# Patient Record
Sex: Female | Born: 1985 | Race: White | Hispanic: No | Marital: Married | Smoking: Current some day smoker
Health system: Southern US, Community
[De-identification: ages and names within clinical notes are randomized; demographics above are authoritative.]

## PROBLEM LIST (undated history)

## (undated) DIAGNOSIS — R112 Nausea with vomiting, unspecified: Secondary | ICD-10-CM

## (undated) DIAGNOSIS — R4589 Other symptoms and signs involving emotional state: Secondary | ICD-10-CM

## (undated) DIAGNOSIS — Z789 Other specified health status: Secondary | ICD-10-CM

## (undated) HISTORY — DX: Other specified health status: Z78.9

## (undated) HISTORY — PX: MOUTH SURGERY: SHX715

## (undated) HISTORY — DX: Nausea with vomiting, unspecified: R11.2

## (undated) HISTORY — DX: Other symptoms and signs involving emotional state: R45.89

---
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
## 2007-04-22 ENCOUNTER — Other Ambulatory Visit: Admission: RE | Admit: 2007-04-22 | Discharge: 2007-04-22 | Payer: Self-pay | Admitting: Gynecology

## 2009-05-01 ENCOUNTER — Ambulatory Visit: Payer: Self-pay | Admitting: Obstetrics and Gynecology

## 2009-05-01 ENCOUNTER — Inpatient Hospital Stay (HOSPITAL_COMMUNITY): Admission: AD | Admit: 2009-05-01 | Discharge: 2009-05-01 | Payer: Self-pay | Admitting: Obstetrics and Gynecology

## 2009-05-26 ENCOUNTER — Inpatient Hospital Stay (HOSPITAL_COMMUNITY): Admission: AD | Admit: 2009-05-26 | Discharge: 2009-05-26 | Payer: Self-pay | Admitting: Obstetrics & Gynecology

## 2009-05-28 ENCOUNTER — Ambulatory Visit: Payer: Self-pay | Admitting: Obstetrics and Gynecology

## 2009-05-28 ENCOUNTER — Inpatient Hospital Stay (HOSPITAL_COMMUNITY): Admission: AD | Admit: 2009-05-28 | Discharge: 2009-05-28 | Payer: Self-pay | Admitting: Obstetrics & Gynecology

## 2009-05-31 ENCOUNTER — Inpatient Hospital Stay (HOSPITAL_COMMUNITY): Admission: AD | Admit: 2009-05-31 | Discharge: 2009-05-31 | Payer: Self-pay | Admitting: Obstetrics & Gynecology

## 2009-06-01 ENCOUNTER — Ambulatory Visit: Payer: Self-pay | Admitting: Family

## 2009-06-01 ENCOUNTER — Inpatient Hospital Stay (HOSPITAL_COMMUNITY): Admission: AD | Admit: 2009-06-01 | Discharge: 2009-06-03 | Payer: Self-pay | Admitting: Obstetrics & Gynecology

## 2010-02-20 ENCOUNTER — Ambulatory Visit (HOSPITAL_COMMUNITY)
Admission: RE | Admit: 2010-02-20 | Discharge: 2010-02-20 | Payer: Self-pay | Source: Home / Self Care | Admitting: Internal Medicine

## 2010-04-24 IMAGING — US US ABDOMEN COMPLETE
1 series · 14 of 25 positions shown · non-contrast
Comparison: None

CLINICAL DATA: Nausea, abdominal pain

ULTRASOUND ABDOMEN:
TECHNIQUE: Sonography of upper abdominal structures was performed.

[Series 1: us abdomen complete · 0.28mm/px · 14 of 95 slices shown]
[im 1/95]
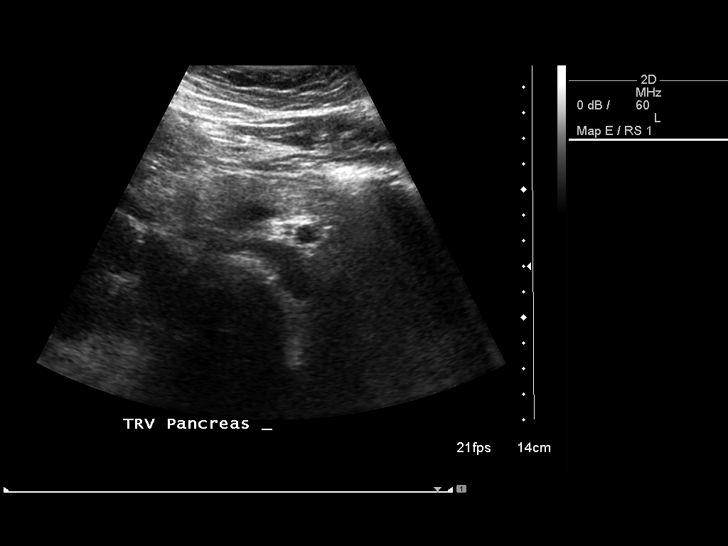
[im 8/95]
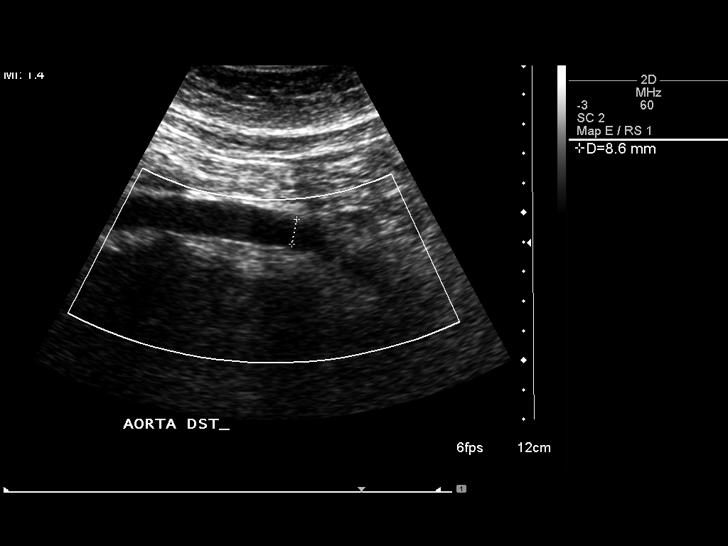
[im 16/95]
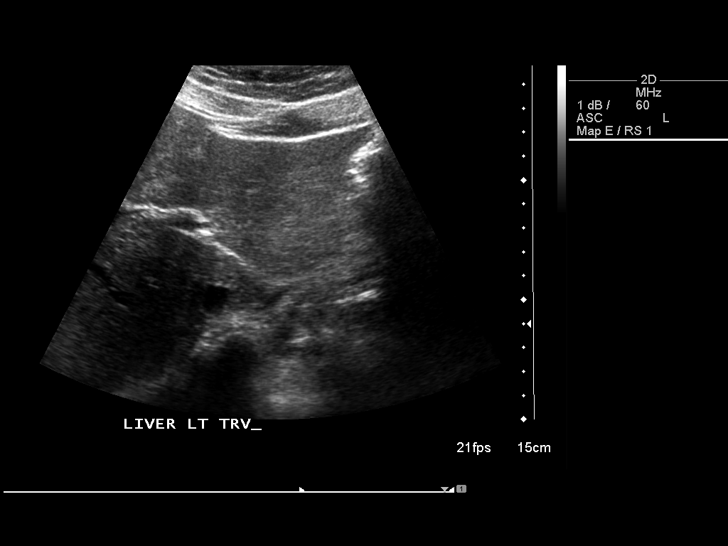
[im 24/95]
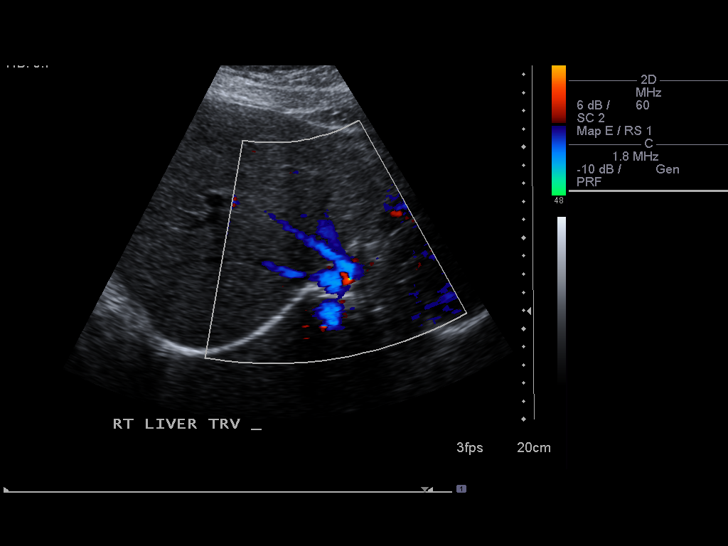
[im 32/95]
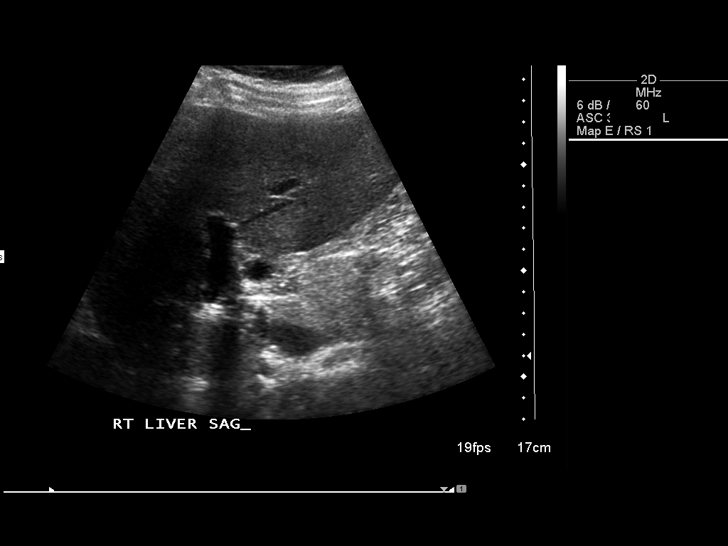
[im 36/95]
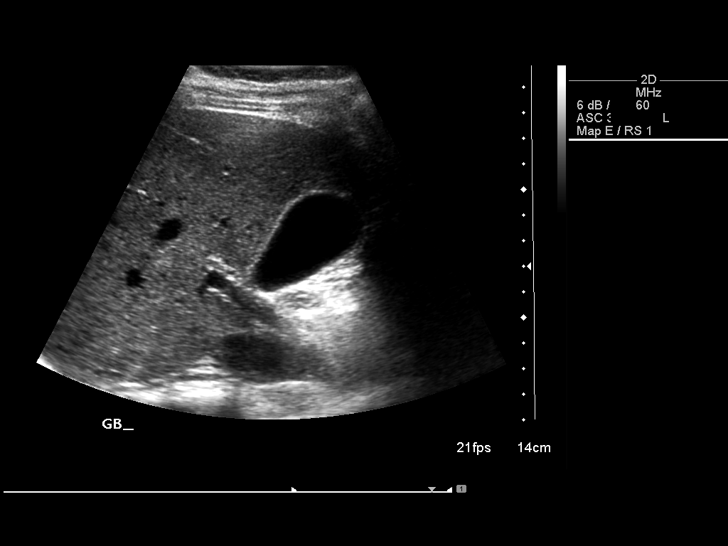
[im 44/95]
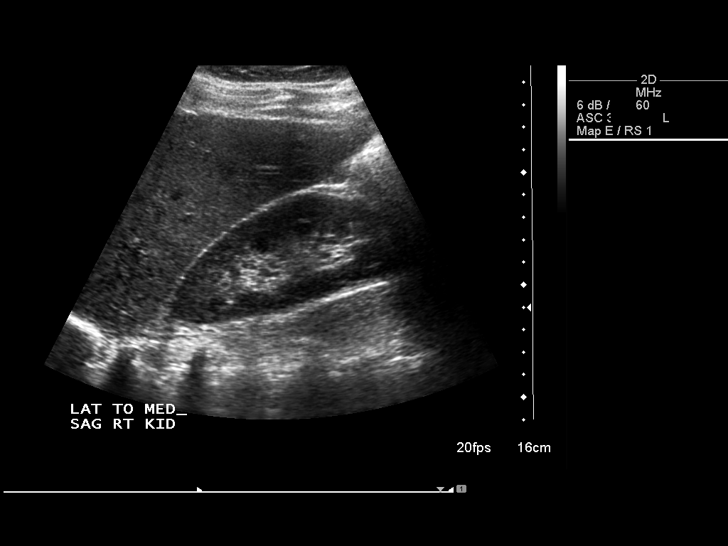
[im 51/95]
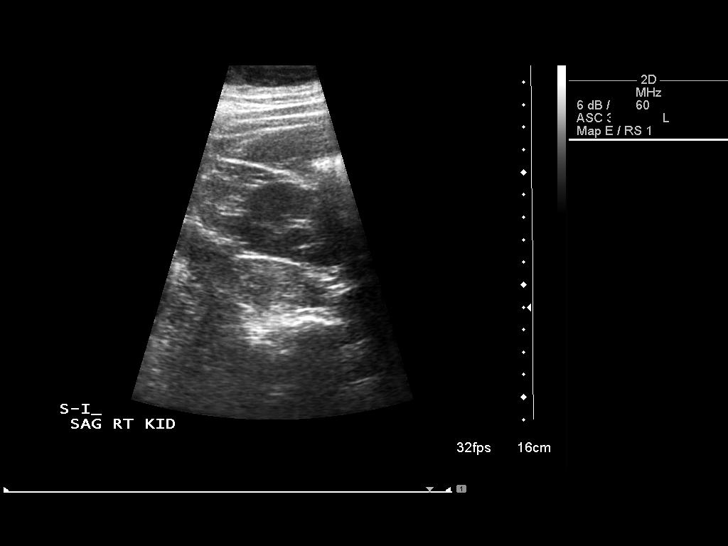
[im 59/95]
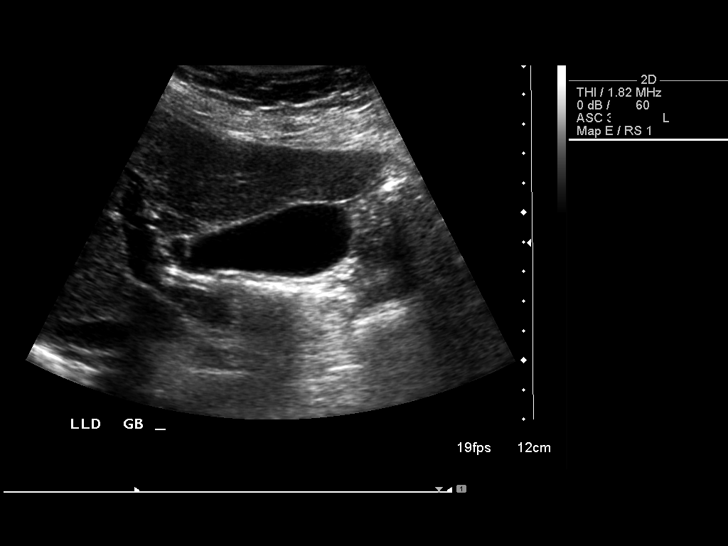
[im 63/95]
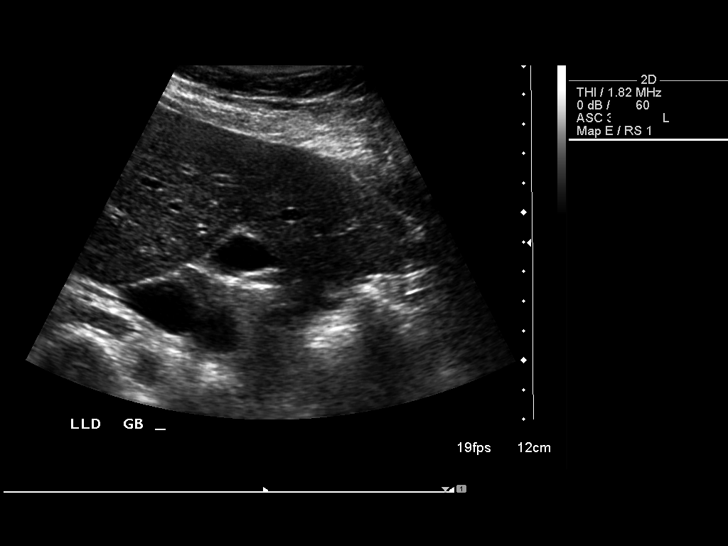
[im 71/95]
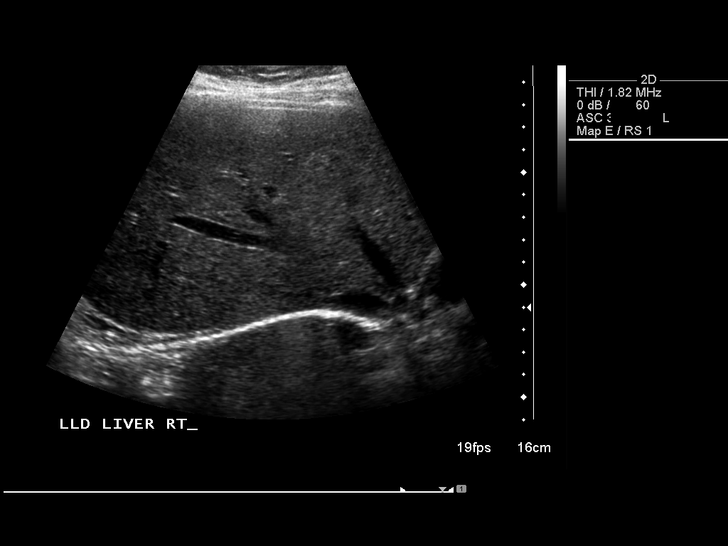
[im 79/95]
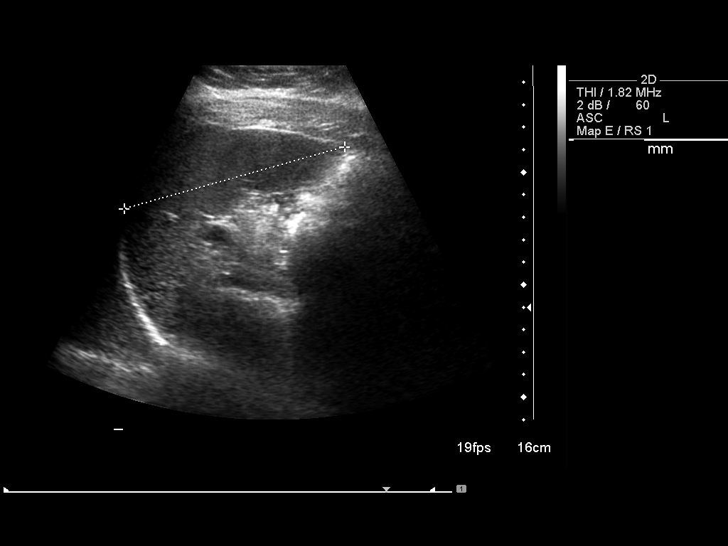
[im 87/95]
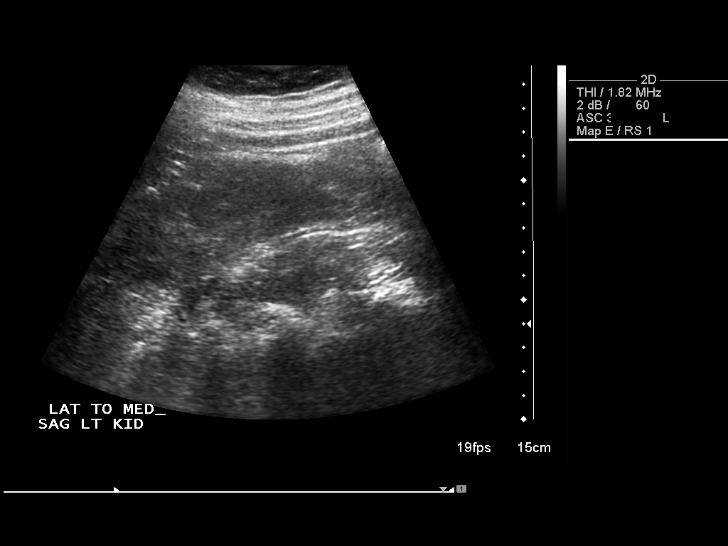
[im 95/95]
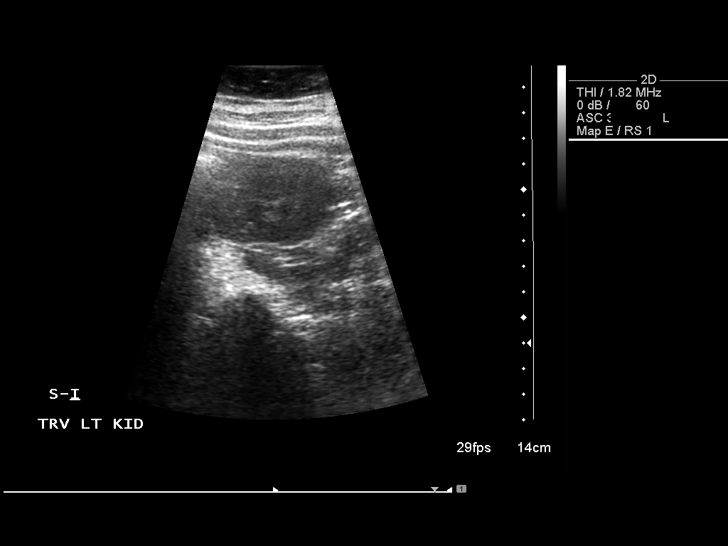

[14 of 25 positions shown; findings below may reference images not displayed]

Gallbladder:  Normally distended without stones or wall thickening.
No pericholecystic fluid or sonographic Murphy's sign.

Common bile duct:  Normal caliber 2 mm diameter.

Liver:  Normal appearance

IVC:  Unremarkable

Pancreas:  Small portion of head and uncinate process obscured by
bowel gas, remainder normal in appearance.

Spleen:  Normal appearance, 10.2 cm length.

Right kidney:  11.9 cm length.  Normal morphology without mass or
hydronephrosis.

Left kidney:  11.8 cm length.  Normal morphology without mass or
hydronephrosis.

Aorta:  Unremarkable

Other:  No free fluid
IMPRESSION: Incomplete pancreatic visualization due to bowel gas.
Remainder of exam normal appearance.

## 2010-05-10 ENCOUNTER — Emergency Department (HOSPITAL_COMMUNITY)
Admission: EM | Admit: 2010-05-10 | Discharge: 2010-05-10 | Payer: Self-pay | Source: Home / Self Care | Admitting: Emergency Medicine

## 2011-01-20 LAB — URINALYSIS, ROUTINE W REFLEX MICROSCOPIC
Glucose, UA: NEGATIVE mg/dL
Nitrite: NEGATIVE
Protein, ur: NEGATIVE mg/dL
Specific Gravity, Urine: >1.03 — ABNORMAL HIGH (ref 1.005–1.030)

## 2011-01-20 LAB — BASIC METABOLIC PANEL
CO2: 29 mEq/L (ref 19–32)
Calcium: 9.3 mg/dL (ref 8.4–10.5)
Chloride: 102 mEq/L (ref 96–112)
GFR calc Af Amer: >60 mL/min (ref >60)
Glucose, Bld: 91 mg/dL (ref 70–99)

## 2011-01-20 LAB — DIFFERENTIAL
Eosinophils Relative: 1 % (ref 0–5)
Lymphs Abs: 2 10*3/uL (ref 0.7–4.0)
Monocytes Absolute: 0.7 10*3/uL (ref 0.1–1.0)
Monocytes Relative: 9 % (ref 3–12)
Neutro Abs: 5.4 10*3/uL (ref 1.7–7.7)

## 2011-01-20 LAB — CBC
Hemoglobin: 12.2 g/dL (ref 12.0–15.0)
MCH: 25.5 pg — ABNORMAL LOW (ref 26.0–34.0)

## 2011-01-20 LAB — URINE CULTURE

## 2011-01-20 LAB — URINE MICROSCOPIC-ADD ON

## 2011-02-10 LAB — CBC
MCHC: 33.8 g/dL (ref 30.0–36.0)
Platelets: 198 10*3/uL (ref 150–400)
RBC: 4.08 MIL/uL (ref 3.87–5.11)
RDW: 14.4 % (ref 11.5–15.5)

## 2011-02-10 LAB — RPR: RPR Ser Ql: NONREACTIVE

## 2013-04-26 ENCOUNTER — Encounter: Payer: Self-pay | Admitting: *Deleted

## 2013-04-27 ENCOUNTER — Ambulatory Visit (INDEPENDENT_AMBULATORY_CARE_PROVIDER_SITE_OTHER): Payer: BC Managed Care – PPO | Admitting: Adult Health

## 2013-04-27 ENCOUNTER — Encounter: Payer: Self-pay | Admitting: Adult Health

## 2013-04-27 VITALS — BP 122/80 | Ht 63.0 in | Wt 185.0 lb

## 2013-04-27 DIAGNOSIS — Z3202 Encounter for pregnancy test, result negative: Secondary | ICD-10-CM

## 2013-04-27 DIAGNOSIS — R4589 Other symptoms and signs involving emotional state: Secondary | ICD-10-CM

## 2013-04-27 DIAGNOSIS — R112 Nausea with vomiting, unspecified: Secondary | ICD-10-CM | POA: Insufficient documentation

## 2013-04-27 DIAGNOSIS — Z3200 Encounter for pregnancy test, result unknown: Secondary | ICD-10-CM

## 2013-04-27 HISTORY — DX: Other symptoms and signs involving emotional state: R45.89

## 2013-04-27 HISTORY — DX: Nausea with vomiting, unspecified: R11.2

## 2013-04-27 LAB — COMPREHENSIVE METABOLIC PANEL
ALT: 15 U/L (ref 0–35)
AST: 18 U/L (ref 0–37)
Potassium: 4.5 mEq/L (ref 3.5–5.3)
Total Bilirubin: 0.5 mg/dL (ref 0.3–1.2)
Total Protein: 7.5 g/dL (ref 6.0–8.3)

## 2013-04-27 LAB — POCT URINE PREGNANCY: Preg Test, Ur: NEGATIVE

## 2013-04-27 LAB — CBC
MCHC: 33.9 g/dL (ref 30.0–36.0)
MCV: 77.4 fL — ABNORMAL LOW (ref 78.0–100.0)
WBC: 8 10*3/uL (ref 4.0–10.5)

## 2013-04-27 NOTE — Patient Instructions (Addendum)
Call in am for results Eat often

## 2013-04-27 NOTE — Progress Notes (Signed)
Subjective:     Patient ID: Rhonda Ali, female   DOB: Apr 03, 1986, 27 y.o.   MRN: [id number]  HPI Rhonda Ali is a 27 year old white female in complaining of nausea and vomiting, and moodiness like when she was pregnant. She has had negative pregnancy test at home.She has the mirena IUD and has had spotting on and off since last period which around 6/214.And she has a decrease in appetite.She does say that she feels better after she vomits and is ok after that.  Review of SystemsPositives in HPI Patient denies any headaches, blurred vision, shortness of breath, chest pain, abdominal pain, problems with bowel movements, urination, or intercourse. No joint pain.  Reviewed past medical,surgical, social and family history. Reviewed medications and allergies.     Objective:   Physical Exam BP 122/80  Ht 5\' 3"  (1.6 m)  Wt 185 lb (83.915 kg)  BMI 32.78 kg/m2  LMP 06/02/2014Urine pregnancy test negative. Skin warm and dry, abdomen soft with no HSM and no tenderness noted. Pelvic: external genitalia is normal in appearance, vagina: is normal in appearance,, cervix:smooth and bulbous,IUD strings are visible, no bleeding noted, and no CMT, uterus: normal size, shape and contour, non tender, no masses felt, adnexa: no masses or tenderness noted.     Assessment:      Nausea and vomiting Moodiness    Plan:      Call in am for lab results Check CBC,CMP,TSH and [facility] Eat often and eat bland, avoid spicy and fried foods today

## 2013-04-27 NOTE — Progress Notes (Signed)
Patient ID: Rhonda Ali, female   DOB: [birth year], 27 y.o.   MRN: [id number] Pt states she is having terrible nausea in the mornings, some foods and smells make her sick. She stated she has been very emotional and moody lately. Some dizziness noted as well. Pt states she just feels funny. Took home preg test and it was negative. Pt has mirena IUD.

## 2013-04-28 ENCOUNTER — Telehealth: Payer: Self-pay | Admitting: Adult Health

## 2013-04-28 LAB — TSH: TSH: 0.779 u[IU]/mL (ref 0.350–4.500)

## 2013-04-28 NOTE — Telephone Encounter (Signed)
Jennifer, please review labs and let me know if you want me to call her. Thanks! 

## 2013-04-28 NOTE — Telephone Encounter (Signed)
JAG, she left a different number. [phone]. Thanks!!!

## 2013-04-28 NOTE — Telephone Encounter (Signed)
Pt aware of labs and she feels better today, call prn

## 2014-07-19 ENCOUNTER — Ambulatory Visit (INDEPENDENT_AMBULATORY_CARE_PROVIDER_SITE_OTHER): Payer: BC Managed Care – PPO | Admitting: Advanced Practice Midwife

## 2014-07-19 ENCOUNTER — Encounter: Payer: Self-pay | Admitting: Advanced Practice Midwife

## 2014-07-19 VITALS — BP 140/80 | Ht 63.0 in | Wt 154.0 lb

## 2014-07-19 DIAGNOSIS — Z3202 Encounter for pregnancy test, result negative: Secondary | ICD-10-CM

## 2014-07-19 DIAGNOSIS — Z30433 Encounter for removal and reinsertion of intrauterine contraceptive device: Secondary | ICD-10-CM

## 2014-07-19 LAB — POCT URINE PREGNANCY: PREG TEST UR: NEGATIVE

## 2014-07-19 NOTE — Progress Notes (Signed)
Rhonda Ali is a 28 y.o. year old Caucasian female who presents for removal and replacement of a  IUD.  It has been 5 years since her previous Mirena IUD placement, and will be getting a Lyletta IUD today.  The risks and benefits of the method and placement have been thouroughly reviewed with the patient and all questions were answered.  Specifically the patient is aware of failure rate of 11/998, expulsion of the IUD and of possible perforation.  The patient is aware of irregular bleeding due to the method and understands the incidence of irregular bleeding diminishes with time.  Time out was performed.  A Graves speculum was placed.  The cervix was prepped using Betadine. The strings were found to be  visible.   They were grasped and the Mirena was easily removed. The cervix was then grasped with a tenaculum and the uterus was sounded to 7 cm. The IUD was inserted to 7 cm.  It was pulled back 1 cm and the IUD was disengaged. After 10 seconds, the IUD was inserted back to the fundus.  The strings were trimmed to 3 cm.  Sonogram was performed and the proper placement of the IUD was verified.  The patient was instructed on signs and symptoms of infection and to check for the strings after each menses or each month.  The patient is to refrain from intercourse for 3 days.

## 2014-08-03 ENCOUNTER — Ambulatory Visit: Payer: BC Managed Care – PPO | Admitting: Advanced Practice Midwife

## 2014-08-17 ENCOUNTER — Ambulatory Visit: Payer: BC Managed Care – PPO | Admitting: Advanced Practice Midwife

## 2014-09-05 ENCOUNTER — Encounter: Payer: Self-pay | Admitting: Advanced Practice Midwife

## 2017-05-27 ENCOUNTER — Encounter: Payer: Self-pay | Admitting: Women's Health

## 2017-05-27 ENCOUNTER — Ambulatory Visit (INDEPENDENT_AMBULATORY_CARE_PROVIDER_SITE_OTHER): Payer: Managed Care, Other (non HMO) | Admitting: Women's Health

## 2017-05-27 VITALS — BP 110/70 | HR 76 | Wt 142.2 lb

## 2017-05-27 DIAGNOSIS — Z3046 Encounter for surveillance of implantable subdermal contraceptive: Secondary | ICD-10-CM

## 2017-05-27 DIAGNOSIS — Z30017 Encounter for initial prescription of implantable subdermal contraceptive: Secondary | ICD-10-CM | POA: Insufficient documentation

## 2017-05-27 DIAGNOSIS — Z3202 Encounter for pregnancy test, result negative: Secondary | ICD-10-CM

## 2017-05-27 DIAGNOSIS — Z30432 Encounter for removal of intrauterine contraceptive device: Secondary | ICD-10-CM

## 2017-05-27 LAB — POCT URINE PREGNANCY: Preg Test, Ur: NEGATIVE

## 2017-05-27 MED ORDER — ETONOGESTREL 68 MG ~~LOC~~ IMPL
68.0000 mg | DRUG_IMPLANT | Freq: Once | SUBCUTANEOUS | Status: AC
  Administered 2017-05-27: 68 mg via SUBCUTANEOUS

## 2017-05-27 NOTE — Progress Notes (Signed)
Rhonda Ali is a 31 y.o. year old 651P1000 Caucasian female who presents for removal of a Liletta IUD and insertion of Nexplanon. Her Liletta IUD was placed 07/19/14. She wants it removed b/c she can feel it and it bothers her.  Her pregnancy test today was negative.  Risks/benefits/side effects of Nexplanon have been discussed and her questions have been answered.  Specifically, a failure rate of 11/998 has been reported, with an increased failure rate if pt takes St. John's Wort and/or antiseizure medicaitons.  Rhonda Ali is aware of the common side effect of irregular bleeding, which the incidence of decreases over time.  BP 110/70   Pulse 76   Wt 142 lb 3.2 oz (64.5 kg)   BMI 25.19 kg/m   Results for orders placed or performed in visit on 05/27/17 (from the past 24 hour(s))  POCT urine pregnancy   Collection Time: 05/27/17 11:14 AM  Result Value Ref Range   Preg Test, Ur Negative Negative    Time out was performed.  A graves speculum was placed in the vagina.  The cervix was visualized, and the strings were visible. They were grasped and the Liletta was easily removed intact without complications.    She is right-handed, so her left arm, approximately 4 inches proximal from the elbow, was cleansed with alcohol and anesthetized with 2cc of 2% Lidocaine.  The area was cleansed again with betadine and the Nexplanon was inserted per manufacturer's recommendations without difficulty.  3 steri-strips and pressure bandage were applied.  Pt was instructed to keep the area clean and dry, remove pressure bandage in 24 hours, and keep insertion site covered with the steri-strip for 3-5 days.  Back up contraception was recommended for 2 weeks.  She was given a card indicating date Nexplanon was inserted and date it needs to be removed. Follow-up PRN problems, then 1mth for pap & physical.   Marge DuncansBooker, Rhonda Ali CNM, [facility]WHNP-BC 05/27/2017 11:34 AM

## 2017-05-27 NOTE — Patient Instructions (Signed)

## 2017-06-27 ENCOUNTER — Other Ambulatory Visit (HOSPITAL_COMMUNITY)
Admission: RE | Admit: 2017-06-27 | Discharge: 2017-06-27 | Disposition: A | Discharge status: Home / Self Care | Payer: Managed Care, Other (non HMO) | Source: Ambulatory Visit | Attending: Obstetrics & Gynecology | Admitting: Obstetrics & Gynecology

## 2017-06-27 ENCOUNTER — Encounter: Payer: Self-pay | Admitting: Women's Health

## 2017-06-27 ENCOUNTER — Ambulatory Visit (INDEPENDENT_AMBULATORY_CARE_PROVIDER_SITE_OTHER): Payer: Managed Care, Other (non HMO) | Admitting: Women's Health

## 2017-06-27 VITALS — BP 90/60 | HR 76 | Ht 62.0 in | Wt 152.0 lb

## 2017-06-27 DIAGNOSIS — Z01411 Encounter for gynecological examination (general) (routine) with abnormal findings: Secondary | ICD-10-CM

## 2017-06-27 DIAGNOSIS — N760 Acute vaginitis: Secondary | ICD-10-CM | POA: Diagnosis not present

## 2017-06-27 DIAGNOSIS — B9689 Other specified bacterial agents as the cause of diseases classified elsewhere: Secondary | ICD-10-CM

## 2017-06-27 DIAGNOSIS — N898 Other specified noninflammatory disorders of vagina: Secondary | ICD-10-CM | POA: Diagnosis not present

## 2017-06-27 DIAGNOSIS — F17200 Nicotine dependence, unspecified, uncomplicated: Secondary | ICD-10-CM | POA: Diagnosis not present

## 2017-06-27 DIAGNOSIS — Z01419 Encounter for gynecological examination (general) (routine) without abnormal findings: Secondary | ICD-10-CM | POA: Diagnosis not present

## 2017-06-27 LAB — POCT WET PREP (WET MOUNT)
CLUE CELLS WET PREP WHIFF POC: POSITIVE
TRICHOMONAS WET PREP HPF POC: ABSENT

## 2017-06-27 MED ORDER — METRONIDAZOLE 500 MG PO TABS: 500.0000 mg | ORAL_TABLET | Freq: Two times a day (BID) | ORAL | 0 refills | Status: DC

## 2017-06-27 NOTE — Addendum Note (Signed)
Addended by: Federico Flake A on: 06/27/2017 09:37 AM   Modules accepted: Orders

## 2017-06-27 NOTE — Progress Notes (Signed)
Subjective:   Rhonda Ali is a 31 y.o. G62P1001 Caucasian female here for a routine well-woman exam.  Patient's last menstrual period was 06/15/2017.    Current complaints: none PCP: [facility]       Does not desire labs, does want gc/ct on pap  Social History: Sexual: heterosexual Marital Status: dating Living situation: w/ parents & her son Occupation: [employer] Tobacco/alcohol: 3 cigarettes/day and working on quitting, occ etoh Illicit drugs: no history of illicit drug use  The following portions of the patient's history were reviewed and updated as appropriate: allergies, current medications, past family history, past medical history, past social history, past surgical history and problem list.  Past Medical History Past Medical History:  Diagnosis Date  . Medical history non-contributory   . Moodiness 04/27/2013  . Nausea & vomiting 04/27/2013    Past Surgical History Past Surgical History:  Procedure Laterality Date  . MOUTH SURGERY      Gynecologic History G1P1000  Patient's last menstrual period was 06/15/2017. Contraception: nexplanon Last Pap: >7yrs ago. Results were: normal Last mammogram: never. Results were: n/a Last TCS: never  Obstetric History OB History  Gravida Para Term Preterm AB Living  1 1 1     1   SAB TAB Ectopic Multiple Live Births          1    # Outcome Date GA Lbr Len/2nd Weight Sex Delivery Anes PTL Lv  1 Term 06/01/09 [redacted]w[redacted]d  7 lb 13 oz (3.544 kg) M Vag-Spont EPI  LIV      Current Medications Current Outpatient Prescriptions on File Prior to Visit  Medication Sig Dispense Refill  . levonorgestrel (MIRENA) 20 MCG/24HR IUD 1 each by Intrauterine route once.     No current facility-administered medications on file prior to visit.     Review of Systems Patient denies any headaches, blurred vision, shortness of breath, chest pain, abdominal pain, problems with bowel movements, urination, or intercourse.  Objective:  BP  90/60   Pulse 76   Ht 5\' 2"  (1.575 m)   Wt 152 lb (68.9 kg)   LMP 06/15/2017   BMI 27.80 kg/m  Physical Exam  General:  Well developed, well nourished, no acute distress. She is alert and oriented x3. Skin:  Warm and dry Neck:  Midline trachea, no thyromegaly or nodules Cardiovascular: Regular rate and rhythm, no murmur heard Lungs:  Effort normal, all lung fields clear to auscultation bilaterally Breasts:  No dominant palpable mass, retraction, or nipple discharge Abdomen:  Soft, non tender, no hepatosplenomegaly or masses Pelvic:  External genitalia is normal in appearance.  The vagina is normal in appearance. The cervix is bulbous, no CMT. +bleeding w/ pap, mod amt thin yellow malodorous d/c.  Thin prep pap is done w/ reflex HR HPV cotesting. Uterus is felt to be normal size, shape, and contour.  No adnexal masses or tenderness noted. Extremities:  No swelling or varicosities noted Psych:  She has a normal mood and affect  Results for orders placed or performed in visit on 06/27/17 (from the past 24 hour(s))  POCT Wet Prep [facility])     Status: Abnormal   Collection Time: 06/27/17  9:30 AM  Result Value Ref Range   Source Wet Prep POC vaginal    WBC, Wet Prep HPF POC mod    Bacteria Wet Prep HPF POC None (A) Few   BACTERIA WET PREP MORPHOLOGY POC     Clue Cells Wet Prep HPF POC  Many (A) None   Clue Cells Wet Prep Whiff POC Positive Whiff    Yeast Wet Prep HPF POC None    KOH Wet Prep POC     Trichomonas Wet Prep HPF POC Absent Absent     Assessment:   Healthy well-woman exam BV Smoker  Plan:  Rx metronidazole 500mg  BID x 7d for BV, no sex or etoh while taking  Advised complete smoking cessation F/U 48yr for physical, or sooner if needed Mammogram @29yo  or sooner if problems Colonoscopy @29yo  or sooner if problems  Marge Duncans CNM, WHNP-BC 06/27/2017 9:30 AM

## 2017-06-27 NOTE — Patient Instructions (Signed)
Bacterial Vaginosis Bacterial vaginosis is a vaginal infection that occurs when the normal balance of bacteria in the vagina is disrupted. It results from an overgrowth of certain bacteria. This is the most common vaginal infection among women ages 15-44. Because bacterial vaginosis increases your risk for STIs (sexually transmitted infections), getting treated can help reduce your risk for chlamydia, gonorrhea, herpes, and HIV (human immunodeficiency virus). Treatment is also important for preventing complications in pregnant women, because this condition can cause an early (premature) delivery. What are the causes? This condition is caused by an increase in harmful bacteria that are normally present in small amounts in the vagina. However, the reason that the condition develops is not fully understood. What increases the risk? The following factors may make you more likely to develop this condition:  Having a new sexual partner or multiple sexual partners.  Having unprotected sex.  Douching.  Having an intrauterine device (IUD).  Smoking.  Drug and alcohol abuse.  Taking certain antibiotic medicines.  Being pregnant.  You cannot get bacterial vaginosis from toilet seats, bedding, swimming pools, or contact with objects around you. What are the signs or symptoms? Symptoms of this condition include:  Grey or white vaginal discharge. The discharge can also be watery or foamy.  A fish-like odor with discharge, especially after sexual intercourse or during menstruation.  Itching in and around the vagina.  Burning or pain with urination.  Some women with bacterial vaginosis have no signs or symptoms. How is this diagnosed? This condition is diagnosed based on:  Your medical history.  A physical exam of the vagina.  Testing a sample of vaginal fluid under a microscope to look for a large amount of bad bacteria or abnormal cells. Your health care provider may use a cotton swab  or a small wooden spatula to collect the sample.  How is this treated? This condition is treated with antibiotics. These may be given as a pill, a vaginal cream, or a medicine that is put into the vagina (suppository). If the condition comes back after treatment, a second round of antibiotics may be needed. Follow these instructions at home: Medicines  Take over-the-counter and prescription medicines only as told by your health care provider.  Take or use your antibiotic as told by your health care provider. Do not stop taking or using the antibiotic even if you start to feel better. General instructions  If you have a female sexual partner, tell her that you have a vaginal infection. She should see her health care provider and be treated if she has symptoms. If you have a female sexual partner, he does not need treatment.  During treatment: ? Avoid sexual activity until you finish treatment. ? Do not douche. ? Avoid alcohol as directed by your health care provider. ? Avoid breastfeeding as directed by your health care provider.  Drink enough water and fluids to keep your urine clear or pale yellow.  Keep the area around your vagina and rectum clean. ? Wash the area daily with warm water. ? Wipe yourself from front to back after using the toilet.  Keep all follow-up visits as told by your health care provider. This is important. How is this prevented?  Do not douche.  Wash the outside of your vagina with warm water only.  Use protection when having sex. This includes latex condoms and dental dams.  Limit how many sexual partners you have. To help prevent bacterial vaginosis, it is best to have sex with just   one partner (monogamous).  Make sure you and your sexual partner are tested for STIs.  Wear cotton or cotton-lined underwear.  Avoid wearing tight pants and pantyhose, especially during summer.  Limit the amount of alcohol that you drink.  Do not use any products that  contain nicotine or tobacco, such as cigarettes and e-cigarettes. If you need help quitting, ask your health care provider.  Do not use illegal drugs. Where to find more information:  Centers for Disease Control and Prevention: www.cdc.gov/std  American Sexual Health Association (ASHA): www.ashastd.org  U.S. Department of Health and Human Services, Office on Women's Health: www.womenshealth.gov/ or https://www.womenshealth.gov/a-z-topics/bacterial-vaginosis Contact a health care provider if:  Your symptoms do not improve, even after treatment.  You have more discharge or pain when urinating.  You have a fever.  You have pain in your abdomen.  You have pain during sex.  You have vaginal bleeding between periods. Summary  Bacterial vaginosis is a vaginal infection that occurs when the normal balance of bacteria in the vagina is disrupted.  Because bacterial vaginosis increases your risk for STIs (sexually transmitted infections), getting treated can help reduce your risk for chlamydia, gonorrhea, herpes, and HIV (human immunodeficiency virus). Treatment is also important for preventing complications in pregnant women, because the condition can cause an early (premature) delivery.  This condition is treated with antibiotic medicines. These may be given as a pill, a vaginal cream, or a medicine that is put into the vagina (suppository). This information is not intended to replace advice given to you by your health care provider. Make sure you discuss any questions you have with your health care provider. Document Released: 10/21/2005 Document Revised: 07/06/2016 Document Reviewed: 07/06/2016 Elsevier Interactive Patient Education  2017 Elsevier Inc.  

## 2017-07-02 LAB — CYTOLOGY - PAP
Chlamydia: POSITIVE — AB
Diagnosis: NEGATIVE
HPV (WINDOPATH): NOT DETECTED
Neisseria Gonorrhea: NEGATIVE

## 2017-07-03 ENCOUNTER — Other Ambulatory Visit: Payer: Self-pay | Admitting: Women's Health

## 2017-07-03 ENCOUNTER — Telehealth: Payer: Self-pay | Admitting: *Deleted

## 2017-07-03 ENCOUNTER — Encounter: Payer: Self-pay | Admitting: Women's Health

## 2017-07-03 DIAGNOSIS — A749 Chlamydial infection, unspecified: Secondary | ICD-10-CM | POA: Insufficient documentation

## 2017-07-03 MED ORDER — AZITHROMYCIN 500 MG PO TABS: 1000.0000 mg | ORAL_TABLET | Freq: Once | ORAL | 0 refills | Status: AC

## 2017-07-03 NOTE — Telephone Encounter (Signed)
Pt informed of +chlamydia result. Informed pt that Rx for azithromycin had been sent to pharmacy. Advised pt that her partner needed to be treated as well. She will call back with his info if he wants us to send in medication. Advised no sex until 7 days after she and her partner have taken medication. Advised that she would need a uriine POC in 3-4 weeks. Pt verbalized understanding.

## 2017-07-04 ENCOUNTER — Other Ambulatory Visit: Payer: Self-pay | Admitting: Women's Health

## 2017-07-04 NOTE — Progress Notes (Signed)
[name], [name], RN  [name], [name], [place]M        Wants partner treated:   Rhonda LorenzoKody Rigsbee [phone]  [birth year]  NKDA  [facility]    Azithromycin 1gm po x 1 w/ 0RF called in for pt's partner as above.  Cheral MarkerKimberly R. Booker, CNM, [facility]NP-BC 07/04/2017 11:47 AM

## 2017-07-24 ENCOUNTER — Other Ambulatory Visit: Payer: Managed Care, Other (non HMO)

## 2017-07-24 DIAGNOSIS — Z113 Encounter for screening for infections with a predominantly sexual mode of transmission: Secondary | ICD-10-CM

## 2017-07-24 DIAGNOSIS — A749 Chlamydial infection, unspecified: Secondary | ICD-10-CM

## 2017-07-24 NOTE — Addendum Note (Signed)
Addended by: Federico Flake A on: 07/24/2017 09:43 AM   Modules accepted: Orders

## 2017-07-24 NOTE — Addendum Note (Signed)
Addended by: Federico Flake A on: 07/24/2017 10:01 AM   Modules accepted: Orders

## 2017-07-26 LAB — GC/CHLAMYDIA PROBE AMP
CHLAMYDIA, DNA PROBE: NEGATIVE
NEISSERIA GONORRHOEAE BY PCR: NEGATIVE

## 2018-03-07 ENCOUNTER — Emergency Department (HOSPITAL_COMMUNITY)
Admission: EM | Admit: 2018-03-07 | Discharge: 2018-03-07 | Disposition: A | Discharge status: Home / Self Care | Payer: 59 | Attending: Emergency Medicine | Admitting: Emergency Medicine

## 2018-03-07 ENCOUNTER — Encounter (HOSPITAL_COMMUNITY): Payer: Self-pay | Admitting: *Deleted

## 2018-03-07 ENCOUNTER — Other Ambulatory Visit: Payer: Self-pay

## 2018-03-07 DIAGNOSIS — H6692 Otitis media, unspecified, left ear: Secondary | ICD-10-CM | POA: Diagnosis not present

## 2018-03-07 DIAGNOSIS — F17210 Nicotine dependence, cigarettes, uncomplicated: Secondary | ICD-10-CM | POA: Diagnosis not present

## 2018-03-07 DIAGNOSIS — Z79899 Other long term (current) drug therapy: Secondary | ICD-10-CM | POA: Insufficient documentation

## 2018-03-07 DIAGNOSIS — H9202 Otalgia, left ear: Secondary | ICD-10-CM | POA: Diagnosis present

## 2018-03-07 MED ORDER — NEOMYCIN-POLYMYXIN-HC 3.5-10000-1 OT SUSP: 4.0000 [drp] | Freq: Three times a day (TID) | OTIC | 0 refills | Status: AC

## 2018-03-07 MED ORDER — AMOXICILLIN 500 MG PO CAPS: 1000.0000 mg | ORAL_CAPSULE | Freq: Three times a day (TID) | ORAL | 0 refills | Status: DC

## 2018-03-07 MED ORDER — AMOXICILLIN 250 MG PO CAPS
1000.0000 mg | ORAL_CAPSULE | Freq: Once | ORAL | Status: AC
  Administered 2018-03-07: 1000 mg via ORAL
  Filled 2018-03-07: qty 4

## 2018-03-07 NOTE — ED Triage Notes (Signed)
Pt c/o left ear pain that started earlier today; pt states she has a bump behind the ear

## 2018-03-07 NOTE — ED Provider Notes (Signed)
[facility] EMERGENCY DEPARTMENT Provider Note   CSN: [id number] Arrival date & time: 03/07/18  0143     History   Chief Complaint Chief Complaint  Patient presents with  . Otalgia    HPI Rhonda Ali is a 32 y.o. female.  Patient reports left ear pain since approximately 3 PM.  States she has had "allergies" for the past several days and then her left ear popped while she was at work today and began hurting her.  She denies any trauma or recent air travel.  No bleeding or drainage.  No fever.  No nausea vomiting, cough or sore throat.  She took some over-the-counter "ear allergy drops" without relief.  The history is provided by the patient.  Otalgia  Pertinent negatives include no abdominal pain, no vomiting, no cough and no rash.    Past Medical History:  Diagnosis Date  . Medical history non-contributory   . Moodiness 04/27/2013  . Nausea & vomiting 04/27/2013    Patient Active Problem List   Diagnosis Date Noted  . Chlamydia 07/03/2017  . Smoker 06/27/2017  . Nexplanon insertion 05/27/2017    Past Surgical History:  Procedure Laterality Date  . MOUTH SURGERY       OB History    Gravida  1   Para  1   Term  1   Preterm      AB      Living  1     SAB      TAB      Ectopic      Multiple      Live Births  1            Home Medications    Prior to Admission medications   Medication Sig Start Date End Date Taking? Authorizing Provider  Etonogestrel (NEXPLANON [place]) Inject into the skin.    [provider]  levonorgestrel (MIRENA) 20 MCG/24HR IUD 1 each by Intrauterine route once.    [provider]  metroNIDAZOLE (FLAGYL) 500 MG tablet Take 1 tablet (500 mg total) by mouth 2 (two) times daily. X 7 days. No sex or alcohol while taking 06/27/17   Cheral Marker, CNM    Family History Family History  Problem Relation Age of Onset  . Heart disease Maternal Grandmother   . Hypertension Maternal Grandmother   . Cancer  Maternal Grandfather        melanoma  . Stroke Maternal Grandfather     Social History Social History   Tobacco Use  . Smoking status: Current Every Day Smoker    Packs/day: 0.50    Years: 4.00    Pack years: 2.00    Types: Cigarettes  . Smokeless tobacco: Never Used  Substance Use Topics  . Alcohol use: No  . Drug use: No    Types: Marijuana     Allergies   Patient has no known allergies.   Review of Systems Review of Systems  Constitutional: Negative for activity change, appetite change and fever.  HENT: Positive for ear pain. Negative for nosebleeds, sinus pressure, sinus pain and tinnitus.   Respiratory: Negative for cough and chest tightness.   Cardiovascular: Negative for chest pain.  Gastrointestinal: Negative for abdominal pain, nausea and vomiting.  Genitourinary: Negative for dysuria and hematuria.  Musculoskeletal: Negative for arthralgias, back pain and myalgias.  Skin: Negative for rash.  Neurological: Negative for dizziness, seizures and weakness.   all other systems are negative except as noted in the HPI  and PMH.     Physical Exam Updated Vital Signs BP 130/73 (BP Location: Left Arm)   Pulse 81   Temp 97.9 F (36.6 C) (Oral)   Resp 17   Ht  (1.6 m)   Wt 81.6 kg (180 lb)   SpO2 98%   BMI 31.89 kg/m   Physical Exam  Constitutional: She is oriented to person, place, and time. She appears well-developed and well-nourished. No distress.  HENT:  Head: Normocephalic and atraumatic.  Mouth/Throat: Oropharynx is clear and moist. No oropharyngeal exudate.  Left ear appears normal to inspection. There is no tragus or mastoid pain. She has a small palpable lymph node inferior to left ear External auditory canal appears mildly erythematous.  Tympanic membrane is red with an effusion behind it and loss of landmarks. No obvious TM perforation  Eyes: Pupils are equal, round, and reactive to light. Conjunctivae and EOM are normal.  Neck: Normal  range of motion. Neck supple.  No meningismus.  Cardiovascular: Normal rate, regular rhythm, normal heart sounds and intact distal pulses.  No murmur heard. Pulmonary/Chest: Effort normal and breath sounds normal. No respiratory distress.  Abdominal: Soft. There is no tenderness. There is no rebound and no guarding.  Musculoskeletal: Normal range of motion. She exhibits no edema or tenderness.  Neurological: She is alert and oriented to person, place, and time. No cranial nerve deficit. She exhibits normal muscle tone. Coordination normal.  No ataxia on finger to nose bilaterally. No pronator drift. 5/5 strength throughout. CN 2-12 intact.Equal grip strength. Sensation intact.   Skin: Skin is warm.  Psychiatric: She has a normal mood and affect. Her behavior is normal.  Nursing note and vitals reviewed.    ED Treatments / Results  Labs (all labs ordered are listed, but only abnormal results are displayed) Labs Reviewed - No data to display  EKG None  Radiology No results found.  Procedures Procedures (including critical care time)  Medications Ordered in ED Medications  amoxicillin (AMOXIL) capsule 1,000 mg (has no administration in time range)     Initial Impression / Assessment and Plan / ED Course  I have reviewed the triage vital signs and the nursing notes.  Pertinent labs & imaging results that were available during my care of the patient were reviewed by me and considered in my medical decision making (see chart for details).    11 hours of left ear pain.  Patient appears to have otitis media as well as possible otitis externa.  No obvious tympanic membrane perforation  Patient was treated with p.o. and topical antibiotics.  PCP follow-up.  Return precautions discussed.  Final Clinical Impressions(s) / ED Diagnoses   Final diagnoses:  Left otitis media, unspecified otitis media type    ED Discharge Orders    None       Joanne Brander, Jeannett Senior, MD 03/07/18  [phone]

## 2018-03-07 NOTE — Discharge Instructions (Addendum)
Take the antibiotics by mouth and use the drops as prescribed.  Follow-up with your doctor.  Return to the ED if you develop new or worsening symptoms.

## 2019-06-09 ENCOUNTER — Other Ambulatory Visit: Payer: Self-pay

## 2019-06-09 DIAGNOSIS — Z20822 Contact with and (suspected) exposure to covid-19: Secondary | ICD-10-CM

## 2019-06-10 LAB — NOVEL CORONAVIRUS, NAA: SARS-CoV-2, NAA: NOT DETECTED

## 2019-11-18 ENCOUNTER — Other Ambulatory Visit: Payer: Self-pay

## 2019-11-18 ENCOUNTER — Ambulatory Visit: Payer: 59 | Attending: Internal Medicine

## 2019-11-18 DIAGNOSIS — Z20822 Contact with and (suspected) exposure to covid-19: Secondary | ICD-10-CM

## 2019-11-19 LAB — NOVEL CORONAVIRUS, NAA: SARS-CoV-2, NAA: NOT DETECTED

## 2019-12-02 ENCOUNTER — Other Ambulatory Visit: Payer: Self-pay

## 2019-12-02 ENCOUNTER — Ambulatory Visit
Admission: EM | Admit: 2019-12-02 | Discharge: 2019-12-02 | Disposition: A | Discharge status: Home / Self Care | Payer: 59 | Attending: Emergency Medicine | Admitting: Emergency Medicine

## 2019-12-02 DIAGNOSIS — S46811A Strain of other muscles, fascia and tendons at shoulder and upper arm level, right arm, initial encounter: Secondary | ICD-10-CM

## 2019-12-02 DIAGNOSIS — M62838 Other muscle spasm: Secondary | ICD-10-CM

## 2019-12-02 MED ORDER — PREDNISONE 10 MG (21) PO TBPK: ORAL_TABLET | Freq: Every day | ORAL | 0 refills | Status: DC

## 2019-12-02 MED ORDER — CYCLOBENZAPRINE HCL 10 MG PO TABS: 10.0000 mg | ORAL_TABLET | Freq: Every day | ORAL | 0 refills | Status: DC

## 2019-12-02 NOTE — ED Triage Notes (Signed)
Pt having right shoulder pain after sleeping on it wrong

## 2019-12-02 NOTE — Discharge Instructions (Signed)
Continue conservative management of rest, ice, heat, and gentle stretches °Prednisone prescribed.  Take as directed and to completion °Take cyclobenzaprine at nighttime for symptomatic relief. Avoid driving or operating heavy machinery while using medication. °Follow up with PCP if symptoms persist °Return or go to the ER if you have any new or worsening symptoms (fever, chills, chest pain, abdominal pain, changes in bowel or bladder habits, pain radiating into lower legs, etc...)  °

## 2019-12-02 NOTE — ED Provider Notes (Signed)
[facility]   [id number] 12/02/19 Arrival Time: 1853  CC: RT shoulder pain  SUBJECTIVE: History from: patient. Rhonda Ali is a 34 y.o. female complains of RT shoulder pain x 2 weeks.  Denies a precipitating event or specific injury, but speculates she may have slept on it wrong.  Localizes the pain to the posterior shoulder and trapezius.  Describes the pain as intermittent and achy in character.  Has tried OTC medications without relief.  Symptoms are made worse with looking over LT shoulder.  Complains of intermittent numbness in hand.  Denies fever, chills, erythema, ecchymosis, effusion, weakness  ROS: As per HPI.  All other pertinent ROS negative.     Past Medical History:  Diagnosis Date  . Medical history non-contributory   . Moodiness 04/27/2013  . Nausea & vomiting 04/27/2013   Past Surgical History:  Procedure Laterality Date  . MOUTH SURGERY     No Known Allergies No current facility-administered medications on file prior to encounter.   Current Outpatient Medications on File Prior to Encounter  Medication Sig Dispense Refill  . amoxicillin (AMOXIL) 500 MG capsule Take 2 capsules (1,000 mg total) by mouth 3 (three) times daily. 42 capsule 0  . Etonogestrel (NEXPLANON [place]) Inject into the skin.    Marland Kitchen levonorgestrel (MIRENA) 20 MCG/24HR IUD 1 each by Intrauterine route once.     Social History   Socioeconomic History  . Marital status: Divorced    Spouse name: Not on file  . Number of children: Not on file  . Years of education: Not on file  . Highest education level: Not on file  Occupational History  . Not on file  Tobacco Use  . Smoking status: Current Every Day Smoker    Packs/day: 0.50    Years: 4.00    Pack years: 2.00    Types: Cigarettes  . Smokeless tobacco: Never Used  Substance and Sexual Activity  . Alcohol use: No  . Drug use: No    Types: Marijuana  . Sexual activity: Yes    Birth control/protection: I.U.D.  Other Topics Concern   . Not on file  Social History Narrative  . Not on file   Social Determinants of Health   Financial Resource Strain:   . Difficulty of Paying Living Expenses: Not on file  Food Insecurity:   . Worried About Programme researcher, broadcasting/film/video in the Last Year: Not on file  . Ran Out of Food in the Last Year: Not on file  Transportation Needs:   . Lack of Transportation (Medical): Not on file  . Lack of Transportation (Non-Medical): Not on file  Physical Activity:   . Days of Exercise per Week: Not on file  . Minutes of Exercise per Session: Not on file  Stress:   . Feeling of Stress : Not on file  Social Connections:   . Frequency of Communication with Friends and Family: Not on file  . Frequency of Social Gatherings with Friends and Family: Not on file  . Attends Religious Services: Not on file  . Active Member of Clubs or Organizations: Not on file  . Attends Banker Meetings: Not on file  . Marital Status: Not on file  Intimate Partner Violence:   . Fear of Current or Ex-Partner: Not on file  . Emotionally Abused: Not on file  . Physically Abused: Not on file  . Sexually Abused: Not on file   Family History  Problem Relation Age of Onset  .  Heart disease Maternal Grandmother   . Hypertension Maternal Grandmother   . Cancer Maternal Grandfather        melanoma  . Stroke Maternal Grandfather     OBJECTIVE:  Vitals:   12/02/19 1902  BP: (!) 141/87  Pulse: 80  Resp: 17  Temp: 98.8 F (37.1 C)  TempSrc: Oral  SpO2: 95%    General appearance: ALERT; in no acute distress.  Head: NCAT Lungs: Normal respiratory effort; CTAB CV: RRR Musculoskeletal: RT shoulder   Inspection: Skin warm, dry, clear and intact without obvious erythema, effusion, or ecchymosis.  Palpation: TTP over superior lateral trapezius ROM: FROM active and passive Strength: 5/5 shld abduction, 5/5 shld adduction, 5/5 elbow flexion, 5/5 elbow extension, 5/5 grip strength Skin: warm and dry  Neurologic: Ambulates without difficulty; Sensation intact about the upper/ lower extremities Psychological: alert and cooperative; normal mood and affect   ASSESSMENT & PLAN:  1. Strain of right trapezius muscle, initial encounter   2. Trapezius muscle spasm      Meds ordered this encounter  Medications  . predniSONE (STERAPRED UNI-PAK 21 TAB) 10 MG (21) TBPK tablet    Sig: Take by mouth daily. Take 6 tabs by mouth daily  for 2 days, then 5 tabs for 2 days, then 4 tabs for 2 days, then 3 tabs for 2 days, 2 tabs for 2 days, then 1 tab by mouth daily for 2 days    Dispense:  42 tablet    Refill:  0    Order Specific Question:   Supervising Provider    Answer:   Raylene Everts [[id number]]  . cyclobenzaprine (FLEXERIL) 10 MG tablet    Sig: Take 1 tablet (10 mg total) by mouth at bedtime.    Dispense:  15 tablet    Refill:  0    Order Specific Question:   Supervising Provider    Answer:   Raylene Everts [[id number]]    Continue conservative management of rest, ice, heat, and gentle stretches Prednisone prescribed.  Take as directed and to completion Take cyclobenzaprine at nighttime for symptomatic relief. Avoid driving or operating heavy machinery while using medication. Follow up with PCP if symptoms persist Return or go to the ER if you have any new or worsening symptoms (fever, chills, chest pain, abdominal pain, changes in bowel or bladder habits, pain radiating into lower legs, etc...)   Reviewed expectations re: course of current medical issues. Questions answered. Outlined signs and symptoms indicating need for more acute intervention. Patient verbalized understanding. After Visit Summary given.    Lestine Box, PA-C 12/02/19 1914

## 2019-12-14 ENCOUNTER — Other Ambulatory Visit: Payer: Self-pay

## 2019-12-14 ENCOUNTER — Ambulatory Visit: Payer: 59 | Attending: Internal Medicine

## 2019-12-14 DIAGNOSIS — Z20822 Contact with and (suspected) exposure to covid-19: Secondary | ICD-10-CM

## 2019-12-15 LAB — NOVEL CORONAVIRUS, NAA: SARS-CoV-2, NAA: NOT DETECTED

## 2020-02-04 ENCOUNTER — Ambulatory Visit
Admission: EM | Admit: 2020-02-04 | Discharge: 2020-02-04 | Disposition: A | Discharge status: Home / Self Care | Payer: 59 | Attending: Emergency Medicine | Admitting: Emergency Medicine

## 2020-02-04 ENCOUNTER — Other Ambulatory Visit: Payer: Self-pay

## 2020-02-04 DIAGNOSIS — M62838 Other muscle spasm: Secondary | ICD-10-CM

## 2020-02-04 MED ORDER — CYCLOBENZAPRINE HCL 5 MG PO TABS: 5.0000 mg | ORAL_TABLET | Freq: Every day | ORAL | 0 refills | Status: AC

## 2020-02-04 MED ORDER — NAPROXEN 500 MG PO TABS: 500.0000 mg | ORAL_TABLET | Freq: Two times a day (BID) | ORAL | 0 refills | Status: DC
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
## 2020-02-04 NOTE — Discharge Instructions (Addendum)
Recommend RICE: rest, ice, compression, elevation as needed for pain.    Heat therapy (hot compress, warm wash red, hot showers, etc.) can help relax muscles and soothe muscle aches. Cold therapy (ice packs) can be used to help swelling both after injury and after prolonged use of areas of chronic pain/aches.  For pain: recommend 350 mg-1000 mg of Tylenol (acetaminophen) and/or 200 mg - 800 mg of Advil (ibuprofen, Motrin) every 8 hours as needed.  May alternate between the two throughout the day as they are generally safe to take together.  DO NOT exceed more than 3000 mg of Tylenol or 3200 mg of ibuprofen in a 24 hour period as this could damage your stomach, kidneys, liver, or increase your bleeding risk.  May take muscle relaxer as needed for severe pain / spasm.  (This medication may cause you to become tired so it is important you do not drink alcohol or operate heavy machinery while on this medication.  Recommend your first dose to be taken before bedtime to monitor for side effects safely)  Return for
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
## 2020-03-23 ENCOUNTER — Ambulatory Visit: Payer: 59

## 2020-03-23 ENCOUNTER — Ambulatory Visit: Payer: 59 | Admitting: Orthopaedic Surgery

## 2020-03-23 ENCOUNTER — Encounter: Payer: Self-pay | Admitting: Orthopaedic Surgery

## 2020-03-23 ENCOUNTER — Other Ambulatory Visit: Payer: Self-pay

## 2020-03-23 VITALS — BP 122/71 | HR 69 | Ht 64.0 in | Wt 171.0 lb

## 2020-03-23 DIAGNOSIS — M25511 Pain in right shoulder: Secondary | ICD-10-CM | POA: Diagnosis not present

## 2020-03-23 DIAGNOSIS — G8929 Other chronic pain: Secondary | ICD-10-CM

## 2020-03-23 DIAGNOSIS — M792 Neuralgia and neuritis, unspecified: Secondary | ICD-10-CM

## 2020-03-23 MED ORDER — MELOXICAM 7.5 MG PO TABS: ORAL_TABLET | ORAL | 2 refills | Status: DC
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
## 2020-03-28 ENCOUNTER — Ambulatory Visit (HOSPITAL_COMMUNITY): Payer: 59 | Attending: Orthopaedic Surgery | Admitting: Occupational Therapy

## 2020-03-28 ENCOUNTER — Ambulatory Visit (HOSPITAL_COMMUNITY): Payer: 59 | Admitting: Occupational Therapy

## 2020-03-28 ENCOUNTER — Encounter (HOSPITAL_COMMUNITY): Payer: Self-pay | Admitting: Occupational Therapy

## 2020-03-28 ENCOUNTER — Other Ambulatory Visit: Payer: Self-pay

## 2020-03-28 DIAGNOSIS — M25511 Pain in right shoulder: Secondary | ICD-10-CM | POA: Diagnosis present

## 2020-03-28 DIAGNOSIS — R29898 Other symptoms and signs involving the musculoskeletal system: Secondary | ICD-10-CM | POA: Diagnosis present

## 2020-03-28 NOTE — Patient Instructions (Signed)
  1) Flexion Wall Stretch    Face wall, place affected handon wall in front of you. Slide hand up the wall  and lean body in towards the wall. Hold for 20 seconds. Repeat 3-5 times. 1-2 times/day.     2) Towel Stretch with Internal Rotation     Gently pull up (or to the side) your affected arm  behind your back with the assist of a towel. Hold 20 seconds, repeat 3-5 times. 1-2 times/day.             3) Corner Stretch    Stand at a corner of a wall, place your arms on the walls with elbows bent. Lean into the corner until a stretch is felt along the front of your chest and/or shoulders. Hold for 20 seconds. Repeat 3-5X, 1-2 times/day.    4) Posterior Capsule Stretch    Bring the involved arm across chest. Grasp elbow and pull toward chest until you feel a stretch in the back of the upper arm and shoulder. Hold 20 seconds. Repeat 3-5X. Complete 1-2 times/day.    5) Scapular Retraction    Tuck chin back as you pinch shoulder blades together.  Hold 5 seconds. Repeat 3-5X. Complete 1-2 times/day.    6) External Rotation Stretch:     Place your affected hand on the wall with the elbow bent and gently turn your body the opposite direction until a stretch is felt. Hold 20 seconds, repeat 3-5X. Complete 1-2 times/day.      Theraband Exercises  1) (Home) Extension: Isometric / Bilateral Arm Retraction - Sitting   Facing anchor, hold hands and elbow at shoulder height, with elbow bent.  Pull arms back to squeeze shoulder blades together. Repeat 10-15 times. 1-3 times/day.   2) (Clinic) Extension / Flexion (Assist)   Face anchor, pull arms back, keeping elbow straight, and squeze shoulder blades together. Repeat 10-15 times. 1-3 times/day.   Copyright  VHI. All rights reserved.   3) (Home) Retraction: Row - Bilateral (Anchor)   Facing anchor, arms reaching forward, pull hands toward stomach, keeping elbows bent and at your sides and pinching shoulder blades  together. Repeat 10-15 times. 1-3 times/day.   Copyright  VHI. All rights reserved.
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
## 2020-04-11 ENCOUNTER — Ambulatory Visit: Payer: 59 | Admitting: Orthopaedic Surgery

## 2020-04-13 ENCOUNTER — Telehealth (HOSPITAL_COMMUNITY): Payer: Self-pay | Admitting: Occupational Therapy

## 2020-04-13 ENCOUNTER — Ambulatory Visit (HOSPITAL_COMMUNITY): Payer: 59 | Admitting: Occupational Therapy

## 2020-04-13 NOTE — Telephone Encounter (Signed)
pt has a upset stomach she called to cx today's appt.

## 2020-04-19 ENCOUNTER — Other Ambulatory Visit: Payer: Self-pay

## 2020-04-19 ENCOUNTER — Encounter (HOSPITAL_COMMUNITY): Payer: Self-pay | Admitting: Occupational Therapy

## 2020-04-19 ENCOUNTER — Ambulatory Visit (HOSPITAL_COMMUNITY): Payer: 59 | Attending: Orthopaedic Surgery | Admitting: Occupational Therapy

## 2020-04-19 DIAGNOSIS — R29898 Other symptoms and signs involving the musculoskeletal system: Secondary | ICD-10-CM

## 2020-04-19 DIAGNOSIS — M25511 Pain in right shoulder: Secondary | ICD-10-CM

## 2020-04-19 NOTE — Patient Instructions (Signed)
Repeat all exercises 10-15 times, 1-2 times per day. Use a 1 or 2# weight (or a soup can/water bottle) for added strengthening.   1) Shoulder Protraction    Begin with elbows by your side, slowly "punch" straight out in front of you.      2) Shoulder Flexion  Supine:     Standing:         Begin with arms at your side with thumbs pointed up, slowly raise both arms up and forward towards overhead.               3) Horizontal abduction/adduction  Supine:   Standing:           Begin with arms straight out in front of you, bring out to the side in at "T" shape. Keep arms straight entire time.                 4) Internal & External Rotation   Supine:     Standing:     Stand with elbows at the side and elbows bent 90 degrees. Move your forearms away from your body, then bring back inward toward the body.     5) Shoulder Abduction  Supine:     Standing:       Lying on your back begin with your arms flat on the table next to your side. Slowly move your arms out to the side so that they go overhead, in a jumping jack or snow angel movement.    Looped Theraband Exercises: 10X each, 2X/day  1) Wall slide: Place an elastic band around your arms at the level of your wrists as shown. Next, place your forearms and hands along a wall so that your elbows are bent and your arms point towards the ceiling.  Then, protract your shoulder blades forward and then slide your arms up the wall as shown.   2) Lateral Wall Walks: With hands against wall, walk or slide your hands to the side against resistance of the band.   3) Upward/Diagonal Wall Walks: Walk or slide your hand up the wall in a diagonal direction, going against the resistance of the band.

## 2020-04-19 NOTE — Therapy (Signed)
[facility] [address] [place], [place], [address] Phone: [phone]   Fax:  [phone]  Occupational Therapy Treatment  Patient Details  Name: RAMIA SIDNEY MRN: [id number] Date of Birth: Mar 11, 1986 Referring Provider (OT): Dr. Darreld Mclean   Encounter Date: 04/19/2020   OT End of Session - 04/19/20 0935    Visit Number 2    Number of Visits 5    Date for OT Re-Evaluation 05/03/20    Authorization Type U[employer]    Authorization Time Period visit limit 23    Authorization - Visit Number 2    Authorization - Number of Visits 23    OT Start Time 0901    OT Stop Time 0939    OT Time Calculation (min) 38 min    Activity Tolerance Patient tolerated treatment well    Behavior During Therapy [facility] for tasks assessed/performed           Past Medical History:  Diagnosis Date  . Medical history non-contributory   . Moodiness 04/27/2013  . Nausea & vomiting 04/27/2013    Past Surgical History:  Procedure Laterality Date  . MOUTH SURGERY      There were no vitals filed for this visit.   Subjective Assessment - 04/19/20 0900    Subjective  S: The stretches are really  helping.    Currently in Pain? Yes    Pain Score 2     Pain Location Shoulder    Pain Orientation Right    Pain Descriptors / Indicators Sore    Pain Type Acute pain    Pain Radiating Towards N/A    Pain Onset Yesterday    Pain Frequency Intermittent    Aggravating Factors  movement, sleep position    Pain Relieving Factors heating pad, hot bath, occasional pain medication    Effect of Pain on Daily Activities min effect on ADLs    Multiple Pain Sites No              [facility] OT Assessment - 04/19/20 0900      Assessment   Medical Diagnosis right shoulder pain      Precautions   Precautions None                    OT Treatments/Exercises (OP) - 04/19/20 0912      Exercises   Exercises Shoulder      Shoulder Exercises: Supine    Protraction PROM;5 reps;Strengthening;10 reps    Protraction Weight (lbs) 2    Horizontal ABduction PROM;5 reps;Strengthening;10 reps    Horizontal ABduction Weight (lbs) 2    External Rotation PROM;5 reps;Strengthening;10 reps    External Rotation Weight (lbs) 2    Internal Rotation PROM;5 reps;Strengthening;10 reps    Internal Rotation Weight (lbs) 2    Flexion PROM;5 reps;Strengthening;10 reps    Shoulder Flexion Weight (lbs) 2    ABduction PROM;5 reps;Strengthening;10 reps    Shoulder ABduction Weight (lbs) 2      Shoulder Exercises: Standing   Protraction Strengthening;10 reps    Protraction Weight (lbs) 2    Horizontal ABduction Strengthening;10 reps    Horizontal ABduction Weight (lbs) 2    External Rotation Strengthening;10 reps    External Rotation Weight (lbs) 2    Internal Rotation Strengthening;10 reps    Internal Rotation Weight (lbs) 2    Flexion Strengthening;10 reps    Shoulder Flexion Weight (lbs) 2    ABduction Strengthening;10 reps    Shoulder  ABduction Weight (lbs) 2      Shoulder Exercises: Therapy Ball   Other Therapy Ball Exercises green therapy ball: chest press, flexion, circles each direction, 10X each      Shoulder Exercises: ROM/Strengthening   X to V Arms 10X, 2#    Proximal Shoulder Strengthening, Supine 10X each, 2#, no rest breaks    Ball on Wall 1' flexion 1' abduction    Other ROM/Strengthening Exercises red loop band: wall slide with lift off, lateral wall slides, diagonal wall slides, 10X each      Shoulder Exercises: Stretch   Wall Stretch - Flexion 1 rep;30 seconds      Manual Therapy   Manual Therapy Myofascial release    Manual therapy comments completed separately from therapeutic exercise    Myofascial Release myofascial release to right upper arm, trapezius, and scapular regions to decrease pain and fascial restrictions and increase joint ROM                  OT Education - 04/19/20 0924    Education Details RUE  strengthening, red loop theraband    Person(s) Educated Patient    Methods Explanation;Demonstration;Handout    Comprehension Verbalized understanding;Returned demonstration            OT Short Term Goals - 04/19/20 0915      OT SHORT TERM GOAL #1   Title Pt will be provided with and educated on HEP to improve RUE use during ADLs.    Time 4    Period Weeks    Status On-going    Target Date 05/03/20      OT SHORT TERM GOAL #2   Title Pt will increase RUE A/ROM to WNL to improve ability to perform reaching tasks overhead and behind back.    Time 4    Period Weeks    Status On-going      OT SHORT TERM GOAL #3   Title Pt will increase RUE strength to 5/5 throughout to improve ability to performing lifting tasks without increase in pain.    Time 4    Period Weeks    Status On-going      OT SHORT TERM GOAL #4   Title Pt will decrease RUE fascial restrictions to minimal amounts or less to improve mobility and decrease pain during ADLs and work tasks.    Time 4    Period Weeks    Status On-going      OT SHORT TERM GOAL #5   Title Pt will decrease pain in RUE to 2/10 or less during ADLs to improve ability to perform tasks requiring sustained use of RUE without need for rest breaks.    Time 4    Period Weeks    Status On-going                    Plan - 04/19/20 0915    Clinical Impression Statement A: Pt reports improvement in pain and discomfort with shoulder stretches and theraband exercises. Pt with significant improvement in fascial restrictions today, completed manual therapy to address remaining restrictions. Pt completing RUE strengthening in supine using 2# weights, reports exercises feel good.Also completed red loop band exercises and ball on wall for scapular strengthening and stability. Verbal cuing for form and technique.    Body Structure / Function / Physical Skills ADL;Endurance;UE functional use;Fascial restriction;Pain;ROM;IADL;Strength    Plan P:  Follow up on HEP. continue with manual therapy, shoulder strengthening and stability. Add body blade  OT Home Exercise Plan eval: shoulder stretches, red scapular theraband; 6/16: shoulder strengthening using 1 or 2# weights, red loop band    Consulted and Agree with Plan of Care Patient           Patient will benefit from skilled therapeutic intervention in order to improve the following deficits and impairments:   Body Structure / Function / Physical Skills: ADL, Endurance, UE functional use, Fascial restriction, Pain, ROM, IADL, Strength       Visit Diagnosis: Acute pain of right shoulder  Other symptoms and signs involving the musculoskeletal system    Problem List Patient Active Problem List   Diagnosis Date Noted  . Chlamydia 07/03/2017  . Smoker 06/27/2017  . Nexplanon insertion 05/27/2017   Guadelupe Sabin, OTR/L  [phone] 04/19/2020, 9:40 AM  [facility] [address] [place], [place], [address] Phone: [phone]   Fax:  [phone]  Name: SATONYA LUX MRN: [id number] Date of Birth: May 04, 1986

## 2020-04-25 ENCOUNTER — Other Ambulatory Visit: Payer: Self-pay

## 2020-04-25 ENCOUNTER — Encounter: Payer: Self-pay | Admitting: Orthopaedic Surgery

## 2020-04-25 ENCOUNTER — Ambulatory Visit: Payer: 59 | Admitting: Orthopaedic Surgery

## 2020-04-25 VITALS — Ht 64.0 in | Wt 172.0 lb

## 2020-04-25 DIAGNOSIS — M25511 Pain in right shoulder: Secondary | ICD-10-CM

## 2020-04-25 DIAGNOSIS — M792 Neuralgia and neuritis, unspecified: Secondary | ICD-10-CM

## 2020-04-25 DIAGNOSIS — G8929 Other chronic pain: Secondary | ICD-10-CM | POA: Diagnosis not present

## 2020-04-25 NOTE — Progress Notes (Signed)
Patient WC:BJSEG Rhonda Ali, female DOB:1986/07/10, 34 y.o. BTD:[id number]  Chief Complaint  Patient presents with   Shoulder Pain    right shld f/u, con't PT, feeling some better    HPI  Rhonda Ali is a 34 y.o. female who has pain in the neck with radiation to the right shoulder and arm.  She has been going to OT and is improved.  I have read the OT notes.  She has much less pain and is moving better.  She is taking the Mobic with no problems.  She says she is so much better.  I want her to continue OT at least one more session.   Body mass index is 29.52 kg/m.  ROS  Review of Systems  Constitutional: Positive for activity change.  Musculoskeletal: Positive for arthralgias, myalgias and neck pain.  All other systems reviewed and are negative.   All other systems reviewed and are negative.  The following is a summary of the past history medically, past history surgically, known current medicines, social history and family history.  This information is gathered electronically by the computer from prior information and documentation.  I review this each visit and have found including this information at this point in the chart is beneficial and informative.    Past Medical History:  Diagnosis Date   Medical history non-contributory    Moodiness 04/27/2013   Nausea & vomiting 04/27/2013    Past Surgical History:  Procedure Laterality Date   MOUTH SURGERY      Family History  Problem Relation Age of Onset   Heart disease Maternal Grandmother    Hypertension Maternal Grandmother    Cancer Maternal Grandfather        melanoma   Stroke Maternal Grandfather    Healthy Father    High Cholesterol Mother     Social History Social History   Tobacco Use   Smoking status: Current Every Day Smoker    Packs/day: 0.50    Years: 4.00    Pack years: 2.00    Types: Cigarettes   Smokeless tobacco: Never Used  Substance Use Topics   Alcohol use: No   Drug use:  No    Types: Marijuana    No Known Allergies  Current Outpatient Medications  Medication Sig Dispense Refill   Etonogestrel (NEXPLANON Crisp) Inject into the skin.     meloxicam (MOBIC) 7.5 MG tablet Take one tablet twice a day after eating. 60 tablet 2   naproxen (NAPROSYN) 500 MG tablet Take 1 tablet (500 mg total) by mouth 2 (two) times daily. (Patient not taking: Reported on 03/23/2020) 30 tablet 0   No current facility-administered medications for this visit.     Physical Exam  Height 5\' 4"  (1.626 m), weight 172 lb (78 kg).  Constitutional: overall normal hygiene, normal nutrition, well developed, normal grooming, normal body habitus. Assistive device:none  Musculoskeletal: gait and station Limp none, muscle tone and strength are normal, no tremors or atrophy is present.  .  Neurological: coordination overall normal.  Deep tendon reflex/nerve stretch intact.  Sensation normal.  Cranial nerves II-XII intact.   Skin:   Normal overall no scars, lesions, ulcers or rashes. No psoriasis.  Psychiatric: Alert and oriented x 3.  Recent memory intact, remote memory unclear.  Normal mood and affect. Well groomed.  Good eye contact.  Cardiovascular: overall no swelling, no varicosities, no edema bilaterally, normal temperatures of the legs and arms, no clubbing, cyanosis and good capillary refill.  Lymphatic: palpation  is normal.  She has full ROM of neck and right shoulder.  NV intact.  All other systems reviewed and are negative   The patient has been educated about the nature of the problem(s) and counseled on treatment options.  The patient appeared to understand what I have discussed and is in agreement with it.  Encounter Diagnoses  Name Primary?   Chronic right shoulder pain Yes   Radicular pain of right upper extremity     PLAN Call if any problems.  Precautions discussed.  Continue current medications.   Return to clinic 1 month   Electronically Signed Darreld Mclean, MD 6/22/20218:31 AM

## 2020-04-27 ENCOUNTER — Ambulatory Visit (HOSPITAL_COMMUNITY): Payer: 59 | Admitting: Occupational Therapy

## 2020-04-27 ENCOUNTER — Encounter (HOSPITAL_COMMUNITY): Payer: Self-pay | Admitting: Occupational Therapy

## 2020-04-27 ENCOUNTER — Other Ambulatory Visit: Payer: Self-pay

## 2020-04-27 DIAGNOSIS — M25511 Pain in right shoulder: Secondary | ICD-10-CM | POA: Diagnosis not present

## 2020-04-27 DIAGNOSIS — R29898 Other symptoms and signs involving the musculoskeletal system: Secondary | ICD-10-CM
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
## 2020-05-03 ENCOUNTER — Encounter (HOSPITAL_COMMUNITY): Payer: Self-pay | Admitting: Occupational Therapy

## 2020-05-03 ENCOUNTER — Ambulatory Visit (HOSPITAL_COMMUNITY): Payer: 59 | Admitting: Occupational Therapy

## 2020-05-03 ENCOUNTER — Other Ambulatory Visit: Payer: Self-pay

## 2020-05-03 DIAGNOSIS — R29898 Other symptoms and signs involving the musculoskeletal system: Secondary | ICD-10-CM

## 2020-05-03 DIAGNOSIS — M25511 Pain in right shoulder: Secondary | ICD-10-CM

## 2020-05-03 NOTE — Patient Instructions (Signed)
1) Strengthening: Chest Pull - Resisted   Hold Theraband in front of body with hands about shoulder width a part. Pull band a part and back together slowly. Repeat ____ times. Complete ____ set(s) per session.. Repeat ____ session(s) per day.  http://orth.exer.us/926   Copyright  VHI. All rights reserved.   2) PNF Strengthening: Resisted   Standing with resistive band around each hand, bring right arm up and away, thumb back. Repeat __10__ times per set. Do __1__ sets per session. Do _1___ sessions per day.                           3) Resisted External Rotation: in Neutral - Bilateral   Sit or stand, tubing in both hands, elbows at sides, bent to 90, forearms forward. Pinch shoulder blades together and rotate forearms out. Keep elbows at sides. Repeat __10__ times per set. Do __1__ sets per session. Do __1__ sessions per day.  http://orth.exer.us/966   Copyright  VHI. All rights reserved.   4) PNF Strengthening: Resisted   Standing, hold resistive band above head. Bring right arm down and out from side. Repeat __10__ times per set. Do _1___ sets per session. Do _1___ sessions per day.

## 2020-05-03 NOTE — Therapy (Signed)
[place], [place], [address] Phone: [phone]   Fax:  [phone]  Occupational Therapy Reassessment, Treatment, Discharge Summary  Patient Details  Name: Rhonda Ali MRN: [id number] Date of Birth: 1986-06-07 Referring Provider (OT): Dr. Sanjuana Kava   Encounter Date: 05/03/2020   OT End of Session - 05/03/20 0925    Visit Number 4    Number of Visits 5    Date for OT Re-Evaluation 05/03/20    Authorization Type UHC    Authorization Time Period visit limit 23    Authorization - Visit Number 4    Authorization - Number of Visits 23    OT Start Time 0900    OT Stop Time 0925    OT Time Calculation (min) 25 min    Activity Tolerance Patient tolerated treatment well    Behavior During Therapy [facility] for tasks assessed/performed           Past Medical History:  Diagnosis Date  . Medical history non-contributory   . Moodiness 04/27/2013  . Nausea & vomiting 04/27/2013    Past Surgical History:  Procedure Laterality Date  . MOUTH SURGERY      There were no vitals filed for this visit.   Subjective Assessment - 05/03/20 0859    Subjective  S: The tingling has gone so I can sleep at night now.    Currently in Pain? No/denies              [facility] OT Assessment - 05/03/20 0859      Assessment   Medical Diagnosis right shoulder pain      Precautions   Precautions None      Observation/Other Assessments   Focus on Therapeutic Outcomes (FOTO)  87/100   67/100 previous     Palpation   Palpation comment min fascial restrictions in right anterior shoulder and trapezius      AROM   Overall AROM Comments Assessed seated, er/IR adducted    AROM Assessment Site Shoulder    Right/Left Shoulder Right    Right Shoulder Flexion 180 Degrees   151 previous   Right Shoulder ABduction 180 Degrees   148 previous   Right Shoulder Internal Rotation 90 Degrees   same as previous   Right Shoulder External  Rotation 74 Degrees   61 previous     PROM   Overall PROM Comments P/ROM is WNL in all planes      Strength   Overall Strength Comments Assessed seated, er/IR adducted    Strength Assessment Site Shoulder    Right/Left Shoulder Right    Right Shoulder Flexion 5/5   same as previous   Right Shoulder ABduction 5/5   4+/5 previous   Right Shoulder Internal Rotation 5/5   same as previous   Right Shoulder External Rotation 5/5   4+/5 previous                   OT Treatments/Exercises (OP) - 05/03/20 0904      Exercises   Exercises Shoulder      Shoulder Exercises: Supine   Protraction PROM;5 reps    Horizontal ABduction PROM;5 reps    External Rotation PROM;5 reps    Internal Rotation PROM;5 reps    Flexion PROM;5 reps    ABduction PROM;5 reps      Shoulder Exercises: Standing   Horizontal ABduction Theraband;10 reps    Theraband Level (Shoulder Horizontal ABduction) Level 3 (Green)  External Rotation Theraband;10 reps    Theraband Level (Shoulder External Rotation) Level 3 (Green)    Internal Rotation Theraband;10 reps    Theraband Level (Shoulder Internal Rotation) Level 3 (Green)    Diagonals Theraband;10 reps   D1 and D2 patterns   Theraband Level (Shoulder Diagonals) Level 3 (Green)      Shoulder Exercises: ROM/Strengthening   Other ROM/Strengthening Exercises green loop band: wall slide with lift off, lateral wall slides, diagonal wall slides, 10X each                  OT Education - 05/03/20 0918    Education Details green theraband strengthening    Person(s) Educated Patient    Methods Explanation;Demonstration;Handout    Comprehension Verbalized understanding;Returned demonstration            OT Short Term Goals - 05/03/20 0926      OT SHORT TERM GOAL #1   Title Pt will be provided with and educated on HEP to improve RUE use during ADLs.    Time 4    Period Weeks    Status Achieved    Target Date 05/03/20      OT SHORT TERM GOAL  #2   Title Pt will increase RUE A/ROM to WNL to improve ability to perform reaching tasks overhead and behind back.    Time 4    Period Weeks    Status Achieved      OT SHORT TERM GOAL #3   Title Pt will increase RUE strength to 5/5 throughout to improve ability to performing lifting tasks without increase in pain.    Time 4    Period Weeks    Status Achieved      OT SHORT TERM GOAL #4   Title Pt will decrease RUE fascial restrictions to minimal amounts or less to improve mobility and decrease pain during ADLs and work tasks.    Time 4    Period Weeks    Status Achieved      OT SHORT TERM GOAL #5   Title Pt will decrease pain in RUE to 2/10 or less during ADLs to improve ability to perform tasks requiring sustained use of RUE without need for rest breaks.    Time 4    Period Weeks    Status Achieved                    Plan - 05/03/20 0926    Clinical Impression Statement A: Reassessment completed this session, pt has met all goals and reports significant improvement in pain and shoulder mobility during the day. Pt reports she is no longer experiencing tingling in her RUE and she is able to sleep at night now. Pt demonstrates RUE ROM and strength WNL. Pt with good form during strengthening tasks today, occasional verbal cuing for new exercises. Pt is agreeable to discharge today with HEP.    Body Structure / Function / Physical Skills ADL;Endurance;UE functional use;Fascial restriction;Pain;ROM;IADL;Strength    Plan P: Discharge pt    OT Home Exercise Plan eval: shoulder stretches, red scapular theraband; 6/16: shoulder strengthening using 1 or 2# weights, red loop band; 6/30: green theraband strengthening    Consulted and Agree with Plan of Care Patient           Patient will benefit from skilled therapeutic intervention in order to improve the following deficits and impairments:   Body Structure / Function / Physical Skills: ADL, Endurance, UE functional use, Fascial  restriction, Pain, ROM, IADL, Strength       Visit Diagnosis: Acute pain of right shoulder  Other symptoms and signs involving the musculoskeletal system    Problem List Patient Active Problem List   Diagnosis Date Noted  . Chlamydia 07/03/2017  . Smoker 06/27/2017  . Nexplanon insertion 05/27/2017   Guadelupe Sabin, OTR/L  [phone] 05/03/2020, 9:28 AM  [facility] [address] [place], [place], [address] Phone: [phone]   Fax:  [phone]  Name: Rhonda Ali MRN: [id number] Date of Birth: 1986-01-19   OCCUPATIONAL THERAPY DISCHARGE SUMMARY  Visits from Start of Care: 4  Current functional level related to goals / functional outcomes: See above. Pt is completing ADLs and work tasks without difficulty, is now able to sleep.    Remaining deficits: Occasional soreness with sustained use   Education / Equipment: HEP for stretching and strengthening Plan: Patient agrees to discharge.  Patient goals were met. Patient is being discharged due to meeting the stated rehab goals.  ?????

## 2020-05-23 ENCOUNTER — Ambulatory Visit: Payer: 59 | Admitting: Orthopaedic Surgery

## 2020-06-07 ENCOUNTER — Ambulatory Visit (INDEPENDENT_AMBULATORY_CARE_PROVIDER_SITE_OTHER): Payer: 59 | Admitting: Women's Health

## 2020-06-07 ENCOUNTER — Encounter: Payer: Self-pay | Admitting: Women's Health

## 2020-06-07 VITALS — BP 129/79 | HR 74 | Ht 64.0 in | Wt 167.0 lb

## 2020-06-07 DIAGNOSIS — Z3202 Encounter for pregnancy test, result negative: Secondary | ICD-10-CM

## 2020-06-07 DIAGNOSIS — Z3046 Encounter for surveillance of implantable subdermal contraceptive: Secondary | ICD-10-CM | POA: Diagnosis not present

## 2020-06-07 DIAGNOSIS — Z30011 Encounter for initial prescription of contraceptive pills: Secondary | ICD-10-CM

## 2020-06-07 LAB — POCT URINE PREGNANCY: Preg Test, Ur: NEGATIVE

## 2020-06-07 NOTE — Patient Instructions (Signed)
Oral Contraception Use Oral contraceptive pills (OCPs) are medicines that you take to prevent pregnancy. OCPs work by:  Preventing the ovaries from releasing eggs.  Thickening mucus in the lower part of the uterus (cervix), which prevents sperm from entering the uterus.  Thinning the lining of the uterus (endometrium), which prevents a fertilized egg from attaching to the endometrium. OCPs are highly effective when taken exactly as prescribed. However, OCPs do not prevent sexually transmitted infections (STIs). Safe sex practices, such as using condoms while on an OCP, can help prevent STIs. Before taking OCPs, you may have a physical exam, blood test, and Pap test. A Pap test involves taking a sample of cells from your cervix to check for cancer. Discuss with your health care provider the possible side effects of the OCP you may be prescribed. When you start an OCP, be aware that it can take 2-3 months for your body to adjust to changes in hormone levels. How to take oral contraceptive pills Follow instructions from your health care provider about how to start taking your first cycle of OCPs. Your health care provider may recommend that you:  Start the pill on day 1 of your menstrual period. If you start at this time, you will not need any backup form of birth control (contraception), such as condoms.  Start the pill on the first Sunday after your menstrual period or on the day you get your prescription. In these cases, you will need to use backup contraception for the first week.  Start the pill at any time of your cycle. ? If you take the pill within 5 days of the start of your period, you will not need a backup form of contraception. ? If you start at any other time of your menstrual cycle, you will need to use another form of contraception for 7 days. If your OCP is the type called a minipill, it will protect you from pregnancy after taking it for 2 days (48 hours), and you can stop using  backup contraception after that time. After you have started taking OCPs:  If you forget to take 1 pill, take it as soon as you remember. Take the next pill at the regular time.  If you miss 2 or more pills, call your health care provider. Different pills have different instructions for missed doses. Use backup birth control until your next menstrual period starts.  If you use a 28-day pack that contains inactive pills and you miss 1 of the last 7 pills (pills with no hormones), throw away the rest of the non-hormone pills and start a new pill pack. No matter which day you start the OCP, you will always start a new pack on that same day of the week. Have an extra pack of OCPs and a backup contraceptive method available in case you miss some pills or lose your OCP pack. Follow these instructions at home:  Do not use any products that contain nicotine or tobacco, such as cigarettes and e-cigarettes. If you need help quitting, ask your health care provider.  Always use a condom to protect against STIs. OCPs do not protect against STIs.  Use a calendar to mark the days of your menstrual period.  Read the information and directions that came with your OCP. Talk to your health care provider if you have questions. Contact a health care provider if:  You develop nausea and vomiting.  You have abnormal vaginal discharge or bleeding.  You develop a rash.    You miss your menstrual period. Depending on the type of OCP you are taking, this may be a sign of pregnancy. Ask your health care provider for more information.  You are losing your hair.  You need treatment for mood swings or depression.  You get dizzy when taking the OCP.  You develop acne after taking the OCP.  You become pregnant or think you may be pregnant.  You have diarrhea, constipation, and abdominal pain or cramps.  You miss 2 or more pills. Get help right away if:  You develop chest pain.  You develop shortness of  breath.  You have an uncontrolled or severe headache.  You develop numbness or slurred speech.  You develop visual or speech problems.  You develop pain, redness, and swelling in your legs.  You develop weakness or numbness in your arms or legs. Summary  Oral contraceptive pills (OCPs) are medicines that you take to prevent pregnancy.  OCPs do not prevent sexually transmitted infections (STIs). Always use a condom to protect against STIs.  When you start an OCP, be aware that it can take 2-3 months for your body to adjust to changes in hormone levels.  Read all the information and directions that come with your OCP. This information is not intended to replace advice given to you by your health care provider. Make sure you discuss any questions you have with your health care provider. Document Revised: 02/12/2019 Document Reviewed: 12/02/2016 Elsevier Patient Education  2020 Elsevier Inc.  

## 2020-06-07 NOTE — Progress Notes (Signed)
   NEXPLANON REMOVAL Patient name: Rhonda Ali MRN [id number]  Date of birth: Dec 25, 1985 Subjective Findings:   Rhonda Ali is a 34 y.o. G35P1001 Caucasian female being seen today for removal of a Nexplanon. Her Nexplanon was placed 05/27/17.  She desires removal because it's time. Signed copy of informed consent in chart.  Gets married in Oct, plans to start trying for pregnancy maybe late Nov.  Patient's last menstrual period was 05/22/2020. Last pap8/24/18. Results were:  normal The planned method of family planning is OCP (estrogen/progesterone). Smokes 3-4/day, no h/o HTN, DVT/PE, CVA, MI, or migraines w/ aura.  Depression screen PHQ 2/9 05/27/2017  Decreased Interest 0  Down, Depressed, Hopeless 0  PHQ - 2 Score 0    Pertinent History Reviewed:   Reviewed past medical,surgical, social, obstetrical and family history.  Reviewed problem list, medications and allergies. Objective Findings & Procedure:    Vitals:   06/07/20 0855  BP: 129/79  Pulse: 74  Weight: 167 lb (75.8 kg)  Height: 5\' 4"  (1.626 m)  Body mass index is 28.67 kg/m.  Results for orders placed or performed in visit on 06/07/20 (from the past 24 hour(s))  POCT urine pregnancy   Collection Time: 06/07/20  9:01 AM  Result Value Ref Range   Preg Test, Ur Negative Negative     Time out was performed.  Nexplanon site identified.  Area prepped in usual sterile fashon. One cc of 2% lidocaine was used to anesthetize the area at the distal end of the implant. A small stab incision was made right beside the implant on the distal portion.  The Nexplanon rod was grasped using hemostats and removed without difficulty.  There was less than 3 cc blood loss. There were no complications.  Steri-strips were applied over the small incision and a pressure bandage was applied.  The patient tolerated the procedure well. Assessment & Plan:   1) Nexplanon removal She was instructed to keep the area clean and dry, remove pressure  bandage in 24 hours, and keep insertion site covered with the steri-strip for 3-5 days.   Follow-up PRN problems.  2) Contraception management> gave 4pk sample LoLoestrin, condoms x 2wks, when ready to conceive start taking pnv 08/07/20 prior then come off pills  Orders Placed This Encounter  Procedures  . POCT urine pregnancy    Follow-up: Return in about 3 months (around 09/07/2020) for Pap & physical.  13/02/2020 CNM, [facility] 06/07/2020 9:19 AM

## 2020-09-11 ENCOUNTER — Other Ambulatory Visit (HOSPITAL_COMMUNITY)
Admission: RE | Admit: 2020-09-11 | Discharge: 2020-09-11 | Disposition: A | Discharge status: Home / Self Care | Payer: 59 | Source: Ambulatory Visit | Attending: Obstetrics & Gynecology | Admitting: Obstetrics & Gynecology

## 2020-09-11 ENCOUNTER — Ambulatory Visit (INDEPENDENT_AMBULATORY_CARE_PROVIDER_SITE_OTHER): Payer: 59 | Admitting: Women's Health

## 2020-09-11 ENCOUNTER — Other Ambulatory Visit: Payer: Self-pay

## 2020-09-11 ENCOUNTER — Encounter: Payer: Self-pay | Admitting: Women's Health

## 2020-09-11 VITALS — BP 126/63 | HR 73 | Ht 63.0 in | Wt 167.2 lb

## 2020-09-11 DIAGNOSIS — N926 Irregular menstruation, unspecified: Secondary | ICD-10-CM

## 2020-09-11 DIAGNOSIS — Z0000 Encounter for general adult medical examination without abnormal findings: Secondary | ICD-10-CM

## 2020-09-11 DIAGNOSIS — Z124 Encounter for screening for malignant neoplasm of cervix: Secondary | ICD-10-CM | POA: Insufficient documentation

## 2020-09-11 DIAGNOSIS — Z3491 Encounter for supervision of normal pregnancy, unspecified, first trimester: Secondary | ICD-10-CM

## 2020-09-11 DIAGNOSIS — Z01419 Encounter for gynecological examination (general) (routine) without abnormal findings: Secondary | ICD-10-CM | POA: Diagnosis not present

## 2020-09-11 DIAGNOSIS — Z3A01 Less than 8 weeks gestation of pregnancy: Secondary | ICD-10-CM

## 2020-09-11 LAB — POCT URINE PREGNANCY: Preg Test, Ur: POSITIVE — AB

## 2020-09-11 NOTE — Progress Notes (Signed)
WELL-WOMAN EXAMINATION Patient name: Rhonda Ali MRN [id number]  Date of birth: 1986-05-10 Chief Complaint:   Annual Exam  History of Present Illness:   Rhonda Ali is a 34 y.o. G31P1001 Caucasian female being seen today for a routine well-woman exam.  Current complaints: +HPT this am, stopped COCs in Sept. Got married last week!  Not taking pnv yet. No n/v.   Depression screen [facility] 2/9 09/11/2020 05/27/2017  Decreased Interest 0 0  Down, Depressed, Hopeless 0 0  PHQ - 2 Score 0 0  Altered sleeping 0 -  Tired, decreased energy 1 -  Change in appetite 0 -  Feeling bad or failure about yourself  0 -  Trouble concentrating 0 -  Moving slowly or fidgety/restless 0 -  Suicidal thoughts 0 -  PHQ-9 Score 1 -     PCP: none      does not desire labs Patient's last menstrual period was 08/05/2020 (exact date). The current method of family planning is none.  Last pap 2018. Results were: normal. H/O abnormal pap: no Last mammogram: never. Results were: N/A. Family h/o breast cancer: no Last colonoscopy: never. Results were: N/A. Family h/o colorectal cancer: no Review of Systems:   Pertinent items are noted in HPI Denies any headaches, blurred vision, fatigue, shortness of breath, chest pain, abdominal pain, abnormal vaginal discharge/itching/odor/irritation, problems with periods, bowel movements, urination, or intercourse unless otherwise stated above. Pertinent History Reviewed:  Reviewed past medical,surgical, social and family history.  Reviewed problem list, medications and allergies. Physical Assessment:   Vitals:   09/11/20 0840  BP: (!) 142/71  Pulse: 92  Weight: 167 lb 3.2 oz (75.8 kg)  Height: 5\' 3"  (1.6 m)  Body mass index is 29.62 kg/m.        Physical Examination:   General appearance - well appearing, and in no distress  Mental status - alert, oriented to person, place, and time  Psych:  She has a normal mood and affect  Skin - warm and dry, normal color, no  suspicious lesions noted  Chest - effort normal, all lung fields clear to auscultation bilaterally  Heart - normal rate and regular rhythm  Neck:  midline trachea, no thyromegaly or nodules  Breasts - breasts appear normal, no suspicious masses, no skin or nipple changes or  axillary nodes  Abdomen - soft, nontender, nondistended, no masses or organomegaly  Pelvic - VULVA: normal appearing vulva with no masses, tenderness or lesions  VAGINA: normal appearing vagina with normal color and discharge, no lesions  CERVIX: normal appearing cervix without discharge or lesions, no CMT  Thin prep pap is done w/ HR HPV cotesting  UTERUS: uterus is felt to be normal size, shape, consistency and nontender   ADNEXA: No adnexal masses or tenderness noted.  Extremities:  No swelling or varicosities noted  Chaperone: Peggy Dones    No results found for this or any previous visit (from the past 24 hour(s)).  Assessment & Plan:  1) Well-Woman Exam  2) [redacted]wks pregnant by LMP 10/2> start PNV, return in 2wks for dating u/s, gave new ob packet  Labs/procedures today: pap  Mammogram @32yo  or sooner if problems Colonoscopy @34yo  or sooner if problems  Orders Placed This Encounter  Procedures  . OB Comp Less 14 Wks  . POCT urine pregnancy    Meds: No orders of the defined types were placed in this encounter.   Follow-up: Return in about 2 weeks (around 09/25/2020) for dating u/s.  Cheral Marker CNM, [facility] 09/11/2020 9:17 AM

## 2020-09-11 NOTE — Patient Instructions (Signed)
Rhonda Ali, I greatly value your feedback.  If you receive a survey following your visit with [place] today, we appreciate you taking the time to fill it out.  Thanks, Joellyn Haff, CNM, WHNP-BC   [facility] ([address] [place], [place] [address]) Entrance C, located off of [address] Free 24/7 valet parking   Nausea & Vomiting  Have saltine crackers or pretzels by your bed and eat a few bites before you raise your head out of bed in the morning  Eat small frequent meals throughout the day instead of large meals  Drink plenty of fluids throughout the day to stay hydrated, just don't drink a lot of fluids with your meals.  This can make your stomach fill up faster making you feel sick  Do not brush your teeth right after you eat  Products with real ginger are good for nausea, like ginger ale and ginger hard candy Make sure it says made with real ginger!  Sucking on sour candy like lemon heads is also good for nausea  If your prenatal vitamins make you nauseated, take them at night so you will sleep through the nausea  Sea Bands  If you feel like you need medicine for the nausea & vomiting please let us know  If you are unable to keep any fluids or food down please let us know   Constipation  Drink plenty of fluid, preferably water, throughout the day  Eat foods high in fiber such as fruits, vegetables, and grains  Exercise, such as walking, is a good way to keep your bowels regular  Drink warm fluids, especially warm prune juice, or decaf coffee  Eat a 1/2 cup of real oatmeal (not instant), 1/2 cup applesauce, and 1/2-1 cup warm prune juice every day  If needed, you may take Colace (docusate sodium) stool softener once or twice a day to help keep the stool soft.   If you still are having problems with constipation, you may take Miralax once daily as needed to help keep your bowels regular.   Home Blood Pressure Monitoring for Patients    Your provider has recommended that you check your blood pressure (BP) at least once a week at home. If you do not have a blood pressure cuff at home, one will be provided for you. Contact your provider if you have not received your monitor within 1 week.   Helpful Tips for Accurate Home Blood Pressure Checks  . Don't smoke, exercise, or drink caffeine 30 minutes before checking your BP . Use the restroom before checking your BP (a full bladder can raise your pressure) . Relax in a comfortable upright chair . Feet on the ground . Left arm resting comfortably on a flat surface at the level of your heart . Legs uncrossed . Back supported . Sit quietly and don't talk . Place the cuff on your bare arm . Adjust snuggly, so that only two fingertips can fit between your skin and the top of the cuff . Check 2 readings separated by at least one minute . Keep a log of your BP readings . For a visual, please reference this diagram: http://ccnc.care/bpdiagram  Provider Name: [facility]     Phone: [phone]  Zone 1: ALL CLEAR  Continue to monitor your symptoms:  . BP reading is less than 140 (top number) or less than 90 (bottom number)  . No right upper stomach pain . No headaches or  seeing spots . No feeling nauseated or throwing up . No swelling in face and hands  Zone 2: CAUTION Call your doctor's office for any of the following:  . BP reading is greater than 140 (top number) or greater than 90 (bottom number)  . Stomach pain under your ribs in the middle or right side . Headaches or seeing spots . Feeling nauseated or throwing up . Swelling in face and hands  Zone 3: EMERGENCY  Seek immediate medical care if you have any of the following:  . BP reading is greater than160 (top number) or greater than 110 (bottom number) . Severe headaches not improving with Tylenol . Serious difficulty catching your breath . Any worsening symptoms from Zone 2    First Trimester of  Pregnancy The first trimester of pregnancy is from week 1 until the end of week 12 (months 1 through 3). A week after a sperm fertilizes an egg, the egg will implant on the wall of the uterus. This embryo will begin to develop into a baby. Genes from you and your partner are forming the baby. The female genes determine whether the baby is a boy or a girl. At 6-8 weeks, the eyes and face are formed, and the heartbeat can be seen on ultrasound. At the end of 12 weeks, all the baby's organs are formed.  Now that you are pregnant, you will want to do everything you can to have a healthy baby. Two of the most important things are to get good prenatal care and to follow your health care provider's instructions. Prenatal care is all the medical care you receive before the baby's birth. This care will help prevent, find, and treat any problems during the pregnancy and childbirth. BODY CHANGES Your body goes through many changes during pregnancy. The changes vary from woman to woman.   You may gain or lose a couple of pounds at first.  You may feel sick to your stomach (nauseous) and throw up (vomit). If the vomiting is uncontrollable, call your health care provider.  You may tire easily.  You may develop headaches that can be relieved by medicines approved by your health care provider.  You may urinate more often. Painful urination may mean you have a bladder infection.  You may develop heartburn as a result of your pregnancy.  You may develop constipation because certain hormones are causing the muscles that push waste through your intestines to slow down.  You may develop hemorrhoids or swollen, bulging veins (varicose veins).  Your breasts may begin to grow larger and become tender. Your nipples may stick out more, and the tissue that surrounds them (areola) may become darker.  Your gums may bleed and may be sensitive to brushing and flossing.  Dark spots or blotches (chloasma, mask of pregnancy)  may develop on your face. This will likely fade after the baby is born.  Your menstrual periods will stop.  You may have a loss of appetite.  You may develop cravings for certain kinds of food.  You may have changes in your emotions from day to day, such as being excited to be pregnant or being concerned that something may go wrong with the pregnancy and baby.  You may have more vivid and strange dreams.  You may have changes in your hair. These can include thickening of your hair, rapid growth, and changes in texture. Some women also have hair loss during or after pregnancy, or hair that feels dry or thin. Your  hair will most likely return to normal after your baby is born. WHAT TO EXPECT AT YOUR PRENATAL VISITS During a routine prenatal visit:  You will be weighed to make sure you and the baby are growing normally.  Your blood pressure will be taken.  Your abdomen will be measured to track your baby's growth.  The fetal heartbeat will be listened to starting around week 10 or 12 of your pregnancy.  Test results from any previous visits will be discussed. Your health care provider may ask you:  How you are feeling.  If you are feeling the baby move.  If you have had any abnormal symptoms, such as leaking fluid, bleeding, severe headaches, or abdominal cramping.  If you have any questions. Other tests that may be performed during your first trimester include:  Blood tests to find your blood type and to check for the presence of any previous infections. They will also be used to check for low iron levels (anemia) and Rh antibodies. Later in the pregnancy, blood tests for diabetes will be done along with other tests if problems develop.  Urine tests to check for infections, diabetes, or protein in the urine.  An ultrasound to confirm the proper growth and development of the baby.  An amniocentesis to check for possible genetic problems.  Fetal screens for spina bifida and  Down syndrome.  You may need other tests to make sure you and the baby are doing well. HOME CARE INSTRUCTIONS  Medicines  Follow your health care provider's instructions regarding medicine use. Specific medicines may be either safe or unsafe to take during pregnancy.  Take your prenatal vitamins as directed.  If you develop constipation, try taking a stool softener if your health care provider approves. Diet  Eat regular, well-balanced meals. Choose a variety of foods, such as meat or vegetable-based protein, fish, milk and low-fat dairy products, vegetables, fruits, and whole grain breads and cereals. Your health care provider will help you determine the amount of weight gain that is right for you.  Avoid raw meat and uncooked cheese. These carry germs that can cause birth defects in the baby.  Eating four or five small meals rather than three large meals a day may help relieve nausea and vomiting. If you start to feel nauseous, eating a few soda crackers can be helpful. Drinking liquids between meals instead of during meals also seems to help nausea and vomiting.  If you develop constipation, eat more high-fiber foods, such as fresh vegetables or fruit and whole grains. Drink enough fluids to keep your urine clear or pale yellow. Activity and Exercise  Exercise only as directed by your health care provider. Exercising will help you:  Control your weight.  Stay in shape.  Be prepared for labor and delivery.  Experiencing pain or cramping in the lower abdomen or low back is a good sign that you should stop exercising. Check with your health care provider before continuing normal exercises.  Try to avoid standing for long periods of time. Move your legs often if you must stand in one place for a long time.  Avoid heavy lifting.  Wear low-heeled shoes, and practice good posture.  You may continue to have sex unless your health care provider directs you otherwise. Relief of Pain  or Discomfort  Wear a good support bra for breast tenderness.    Take warm sitz baths to soothe any pain or discomfort caused by hemorrhoids. Use hemorrhoid cream if your health care provider  approves.    Rest with your legs elevated if you have leg cramps or low back pain.  If you develop varicose veins in your legs, wear support hose. Elevate your feet for 15 minutes, 3-4 times a day. Limit salt in your diet. Prenatal Care  Schedule your prenatal visits by the twelfth week of pregnancy. They are usually scheduled monthly at first, then more often in the last 2 months before delivery.  Write down your questions. Take them to your prenatal visits.  Keep all your prenatal visits as directed by your health care provider. Safety  Wear your seat belt at all times when driving.  Make a list of emergency phone numbers, including numbers for family, friends, the hospital, and police and fire departments. General Tips  Ask your health care provider for a referral to a local prenatal education class. Begin classes no later than at the beginning of month 6 of your pregnancy.  Ask for help if you have counseling or nutritional needs during pregnancy. Your health care provider can offer advice or refer you to specialists for help with various needs.  Do not use hot tubs, steam rooms, or saunas.  Do not douche or use tampons or scented sanitary pads.  Do not cross your legs for long periods of time.  Avoid cat litter boxes and soil used by cats. These carry germs that can cause birth defects in the baby and possibly loss of the fetus by miscarriage or stillbirth.  Avoid all smoking, herbs, alcohol, and medicines not prescribed by your health care provider. Chemicals in these affect the formation and growth of the baby.  Schedule a dentist appointment. At home, brush your teeth with a soft toothbrush and be gentle when you floss. SEEK MEDICAL CARE IF:   You have dizziness.  You have mild  pelvic cramps, pelvic pressure, or nagging pain in the abdominal area.  You have persistent nausea, vomiting, or diarrhea.  You have a bad smelling vaginal discharge.  You have pain with urination.  You notice increased swelling in your face, hands, legs, or ankles. SEEK IMMEDIATE MEDICAL CARE IF:   You have a fever.  You are leaking fluid from your vagina.  You have spotting or bleeding from your vagina.  You have severe abdominal cramping or pain.  You have rapid weight gain or loss.  You vomit blood or material that looks like coffee grounds.  You are exposed to [place] measles and have never had them.  You are exposed to fifth disease or chickenpox.  You develop a severe headache.  You have shortness of breath.  You have any kind of trauma, such as from a fall or a car accident. Document Released: 10/15/2001 Document Revised: 03/07/2014 Document Reviewed: 08/31/2013 [facility] Patient Information 2015 [place], [place]. This information is not intended to replace advice given to you by your health care provider. Make sure you discuss any questions you have with your health care provider.

## 2020-09-12 ENCOUNTER — Telehealth: Payer: Self-pay | Admitting: Women's Health

## 2020-09-12 ENCOUNTER — Telehealth: Payer: Self-pay | Admitting: *Deleted

## 2020-09-12 ENCOUNTER — Other Ambulatory Visit: Payer: Self-pay | Admitting: Women's Health

## 2020-09-12 LAB — CYTOLOGY - PAP
Comment: NEGATIVE
Diagnosis: NEGATIVE
High risk HPV: NEGATIVE

## 2020-09-12 MED ORDER — BONJESTA 20-20 MG PO TBCR: 1.0000 | EXTENDED_RELEASE_TABLET | Freq: Every day | ORAL | 8 refills | Status: DC

## 2020-09-12 NOTE — Telephone Encounter (Signed)
Patient informed nausea medication was sent to specialty pharmacy due to her having private insurance.  Advised to try taking PNV at night and B6 and/or Unisom to see if that would help.  Pt verbalized understanding.

## 2020-09-12 NOTE — Telephone Encounter (Signed)
Patient uses [facility] on [address] in [place] and would like nausea meds sent in as prev discussed in apt on 09/11/20

## 2020-09-25 ENCOUNTER — Other Ambulatory Visit: Payer: Self-pay | Admitting: Women's Health

## 2020-09-25 ENCOUNTER — Other Ambulatory Visit: Payer: Self-pay

## 2020-09-25 ENCOUNTER — Ambulatory Visit (INDEPENDENT_AMBULATORY_CARE_PROVIDER_SITE_OTHER): Payer: 59 | Admitting: Advanced Practice Midwife

## 2020-09-25 ENCOUNTER — Encounter: Payer: Self-pay | Admitting: Advanced Practice Midwife

## 2020-09-25 ENCOUNTER — Ambulatory Visit (INDEPENDENT_AMBULATORY_CARE_PROVIDER_SITE_OTHER): Payer: 59

## 2020-09-25 ENCOUNTER — Ambulatory Visit: Payer: 59 | Admitting: Advanced Practice Midwife

## 2020-09-25 DIAGNOSIS — O020 Blighted ovum and nonhydatidiform mole: Secondary | ICD-10-CM | POA: Insufficient documentation

## 2020-09-25 DIAGNOSIS — Z3A01 Less than 8 weeks gestation of pregnancy: Secondary | ICD-10-CM

## 2020-09-25 DIAGNOSIS — Z3491 Encounter for supervision of normal pregnancy, unspecified, first trimester: Secondary | ICD-10-CM

## 2020-09-25 MED ORDER — MISOPROSTOL 200 MCG PO TABS: 600.0000 ug | ORAL_TABLET | Freq: Once | ORAL | 0 refills | Status: DC

## 2020-09-25 NOTE — Patient Instructions (Addendum)
Blighted Ovum  A blighted ovum is a common kind of early pregnancy failure and a frequent cause of early pregnancy loss. It is often called a miscarriage. It happens when a fertilized egg attaches to the wall of the uterus but stops growing. Even though the egg never develops, the body acts like it is pregnant. An early miscarriage can be difficult as you will experience the physical symptoms of the loss. You may also experience strong emotional symptoms that accompany the loss of a pregnancy. What are the causes? This condition is usually caused by a problem with the genes (genetic defect) in the egg. What are the signs or symptoms? Early symptoms of this condition are the same as those of a normal early pregnancy. They include:  A missed menstrual period.  Fatigue.  Feeling sick to your stomach (nauseous).  Sore breasts. Later symptoms are those of pregnancy loss. They include:  Abdominal cramps.  Vaginal bleeding or spotting.  A menstrual period that is heavier than usual. How is this diagnosed? This condition is usually diagnosed by a routine ultrasound. It can be confirmed with blood tests. How is this treated? This condition may be treated by:  Waiting until your body naturally gets rid of the empty egg sac and placenta (miscarriage).  Taking medicine to start a miscarriage. This medicine can be taken by mouth or placed into the vagina.  Having a surgical procedure to remove the tissue. Your health care provider would open the cervix (dilation) and remove the tissue from your uterus (curettage). Follow these instructions at home:  Take over-the-counter and prescription medicines only as told by your health care provider.  Talk with your health care provider about future pregnancies and pregnancy planning. Having this condition does not mean you will lose future pregnancies.  To lower your risk of an infection, avoid the use of tampons or douches until your bleeding stops.  Also, avoid intercourse until your bleeding stops.  You can return to your normal activities as soon as you feel well enough. Talk with your health care provider before resuming strenuous physical activity.  After your miscarriage: ? Rest at home for a few days. ? You may have menstrual-like bleeding for a week or more, and you may have light bleeding for a couple weeks after that. Wear a pad until vaginal bleeding stops.  Keep all follow-up visits as told by your health care provider. This is important. Contact a health care provider if:  You have a fever or chills.  Your pain medicine is not helping.  You have vaginal bleeding that continues for longer than expected.  You continue to experience sadness after the loss of your pregnancy. Get help right away if:  You have severe abdominal pain.  You feel dizzy or faint.  You pass out.  You have very heavy vaginal bleeding. A sign that vaginal bleeding is very heavy is if blood soaks through two large sanitary pads an hour for more than two hours. Summary  A blighted ovum is a common kind of early pregnancy loss also known as a miscarriage.  Talk with your health care provider about treatment options that are best for you when experiencing a miscarriage.  After your miscarriage, talk with your health care provider about future pregnancies or family planning needs.  Follow your health care provider's instructions for recovery after your miscarriage. This information is not intended to replace advice given to you by your health care provider. Make sure you discuss any questions  you have with your health care provider. Document Revised: 03/11/2018 Document Reviewed: 03/11/2018 Elsevier Patient Education  2020 [employer]   FACTS YOU SHOULD KNOW  WHAT IS AN EARLY PREGNANCY FAILURE? Once the egg is fertilized with the sperm and begins to develop, it attaches to the lining of the uterus. This early pregnancy tissue may not  develop into an embryo (the beginning stage of a baby). Sometimes an embryo does develop but does not continue to grow. These problems can be seen on ultrasound.   MANAGEMNT OF EARLY PREGNANCY FAILURE: About 4 out of 100 (0.25%) women will have a pregnancy loss in her lifetime.  One in five pregnancies is found to be an early pregnancy failure.  There are 3 ways to care for an early pregnancy failure:    (1) Medicine, (2) Waiting for you to pass the pregnancy on your own. Sometimes, surgery is necessary.   The decision as to how to proceed after being diagnosed with and early pregnancy failure is an individual one.  The decision can be made only after appropriate counseling.  You need to weigh the pros and cons of the choices. Then you can make the choice that works for you. Medicine (CYTOTEC) . The complete procedure may take days to weeks, usually a few days . No Surgery . Bleeding may be heavy at times . There may be drug side effects . Patient has more control Waiting . You may choose to wait, in which case your own body may complete the passing of the abnormal early pregnancy on its own in about 2-4 weeks . Your bleeding may be heavy at times . There is a small possibility that you may need surgery if the bleeding is too much or not all of the pregnancy has passed.  SURGERY (D&C) . Procedure over in 1 day . Requires being put to sleep . Bleeding may be light . Possible problems during surgery, including injury to womb(uterus) . Care provider has more control  CYTOTEC MANAGEMENT Prostaglandins (cytotec) are the most widely used drug for this purpose. They cause the uterus to cramp and contract. You will place the medicine yourself inside your vagina in the privacy of your home. Empting of the uterus should occur within 3 days but the process may continue for several weeks. The bleeding may seem heavy at times. POSSIBLE SIDE EFFECTS FROM CYTOTEC . Nausea    Vomiting . Diarrhea Fever . Chills  Hot Flashes Side effects  from the process of the early pregnancy failure include: . Cramping  Bleeding . Headaches  Dizziness RISKS: This is a low risk procedure. Less than 1 in 100 women has a complication. An incomplete passage of the early pregnancy may occur. Also, Hemorrhage (heavy bleeding) could happen.  Rarely the pregnancy will not be passed completely. Excessively heavy bleeding may occur.  Your doctor may need to perform surgery to empty the uterus (D&E). Afterwards: Everybody will feel differently after the early pregnancy completion. You may have soreness or cramps for a day or two. You may have soreness or cramps for day or two.  You may have light bleeding for up to 2 weeks. You may be as active as you feel like being. If you have any of the following problems you may call [facility] at [phone]. . If you have pain that does not get better  with pain medication . Bleeding that soaks through 2 thick full-sized sanitary pads in an hour . Cramps that last longer  than 2 days . Foul smelling discharge . Fever above 100.4 degrees F Even if you do not have any of these symptoms, you should have a follow-up exam to make sure you are healing properly. This appointment will be made for you before you leave the hospital. Your next normal period will start again in 4-6 week after the loss. You can get pregnant soon after the loss, so use birth control right away. Finally: Make sure all your questions are answered before during and after any procedure. Follow up with medical care and family planning methods.   What to expect after the misoprostol  Cramping, moderate to heavy bleeding, and moderate pain are normal parts of the abortion process as your uterus passes the pregnancy. Most women pass blood clots.  Cramping usually starts one to four hours after you take the misoprostol. Bleeding can take up to 24 hours for some  women. Heavy bleeding and strong cramps usually last between one and four hours. Call or return to the hospital if you soak through more than two large maxi-pads per hour for three hours in a row.  For pain: Resting and using a heating pad or hot water bottle may help. Both Vicodin and ibuprofen usually help with the pain. You may use either one or both together. Call if your pain is not manageable with Vicodin and ibuprofen.  Vicodin: You may take one or two tablets of Vicodin every four to six hours. You may take the first pill as soon as you feel you need something for the pain. Vicodin may make you feel sleepy, nauseated, or dizzy. Do not exceed this dose.  Ibuprofen (Motrin, Advil, etc.): You may take 800mg  of ibuprofen (four over-the-counter tablets) every eight hours. You may take the first dose of pills as soon as you feel you need something for the pain. Ibuprofen usually does not cause side effects such as sleepiness, nausea, or dizziness.  Other misoprostol side effects:  The following side effects are not dangerous and usually only last one to four hours. If any of these side effects make you very uncomfortable, you may treat your symptoms with over-the-counter medicines. Call if over-the-counter medicines do not relieve your symptoms.  Nausea or vomiting  Call if you vomit several times and cannot hold down liquids. Over the counter treatment: Benadryl 25mg  to 50mg  every six hours as needed, or Dramamine 25mg  to 50mg  every six hours as needed.  Diarrhea  Call if you have several episodes of diarrhea lasting more than four to five hours and you are having trouble drinking liquids (you may be getting dehydrated). Over the counter treatment: Imodium 2 tablets (4mg ) once.  Fever, chills  Misoprostol may cause fever or chills in the first 24 hours. After 24 hours, fever or chills may be a sign of an infection and you should call the clinic. If you already took Tylenol, Vicodin (which  has 500mg  of Tylenol in it), or ibuprofen for pain, do not take more of the same medication for fever or chills. Call the clinic if your fever is higher than 101 F (or 39 C). Over the counter treatment: Tylenol 500 to 650mg  every four hours, Ibuprofen 800mg  (four over-the-counter pills) every six to eight hours. How to tell if the miscarriage is complete  Most women have bleeding and painful cramping. As you pass the pregnancy, the bleeding is usually heavy and the cramping very strong. This usually lasts one to four hours. Most women pass some blood clots  in the toilet and the pregnancy is often one of those clots. After the pregnancy passes, the cramps decrease and the bleeding slows down significantly.  Within a few hours after passing the pregnancy, cramps and bleeding should be much improved

## 2020-09-25 NOTE — Progress Notes (Signed)
[place] 8+1 wks GS,no fetal pole or YS visualized,GS 30.6 mm,normal ovaries

## 2020-09-25 NOTE — Progress Notes (Signed)
[facility] Visit  Patient name: Rhonda Ali MRN [id number]  Date of birth: [birth year]  CC & HPI:  JOELEEN Ali is a 34 y.o. Caucasian female presenting today for blighted ovum, dx on dating [place].   Pertinent History Reviewed:  Medical & Surgical Hx:   Past Medical History:  Diagnosis Date  . Medical history non-contributory   . Moodiness 04/27/2013  . Nausea & vomiting 04/27/2013   Past Surgical History:  Procedure Laterality Date  . MOUTH SURGERY     Family History  Problem Relation Age of Onset  . Heart disease Maternal Grandmother   . Hypertension Maternal Grandmother   . Cancer Maternal Grandfather        melanoma  . Stroke Maternal Grandfather   . Healthy Father   . High Cholesterol Mother     Current Outpatient Medications:  .  Prenatal Vit-Fe Fumarate-FA (PRENATAL VITAMIN PO), Take by mouth., Disp: , Rfl:  .  Doxylamine-Pyridoxine ER (BONJESTA) 20-20 MG TBCR, Take 1 tablet by mouth at bedtime. Can add 1 tablet in the morning if needed for nausea and vomiting (Patient not taking: Reported on 09/25/2020), Disp: 60 tablet, Rfl: 8 .  misoprostol (CYTOTEC) 200 MCG tablet, Take 3 tablets (600 mcg total) by mouth once for 1 dose., Disp: 3 tablet, Rfl: 0 Social History: Reviewed -  reports that she has been smoking cigarettes. She has a 2.00 pack-year smoking history. She has never used smokeless tobacco.  Review of Systems:   Constitutional: Negative for fever and chills Eyes: Negative for visual disturbances Respiratory: Negative for shortness of breath, dyspnea Cardiovascular: Negative for chest pain or palpitations  Gastrointestinal: Negative for vomiting, diarrhea and constipation; no abdominal pain Genitourinary: Negative for dysuria and urgency, vaginal irritation or itching Musculoskeletal: Negative for back pain, joint pain, myalgias  Neurological: Negative for dizziness and headaches    Objective Findings:    Physical Examination: There were  no vitals filed for this visit. General appearance - well appearing, and in no distress Mental status - alert, oriented to person, place, and time Chest:  Normal respiratory effort Heart - normal rate and regular rhythm Abdomen:  Soft, nontender Pelvic: deferred Musculoskeletal:  Normal range of motion without pain Extremities:  No edema   [place] 8+1 wks GS,no fetal pole or YS visualized,GS 30.6 mm,normal ovaries  No results found for this or any previous visit (from the past 24 hour(s)).    Assessment & Plan:  A:   Blighted ovum P:  Options discussed (wait and watch, cytotec)  Pt chooses : cytotec  Orders Placed This Encounter  Procedures  . CBC  . Beta hCG quant (ref lab)  . ABO/Rh     Meds ordered this encounter  Medications  . misoprostol (CYTOTEC) 200 MCG tablet    Sig: Take 3 tablets (600 mcg total) by mouth once for 1 dose.    Dispense:  3 tablet    Refill:  0    Order Specific Question:   Supervising Provider    Answer:   Lazaro Arms [[id number]]   Let us know when bleeding starts:  Plan HCG 7-10 days afterwards  Let us know if no bleeding starts (may repeat cytotec X1 if none >48 hours)   No follow-ups on file.  Jacklyn Shell CNM 09/25/2020 5:11 PM

## 2020-09-27 ENCOUNTER — Other Ambulatory Visit: Payer: Self-pay | Admitting: Women's Health

## 2020-09-27 LAB — CBC
Hematocrit: 40.5 % (ref 34.0–46.6)
Hemoglobin: 14 g/dL (ref 11.1–15.9)
MCH: 28.6 pg (ref 26.6–33.0)
MCHC: 34.6 g/dL (ref 31.5–35.7)
MCV: 83 fL (ref 79–97)
Platelets: 263 10*3/uL (ref 150–450)
RBC: 4.9 x10E6/uL (ref 3.77–5.28)
RDW: 13.1 % (ref 11.7–15.4)
WBC: 9 10*3/uL (ref 3.4–10.8)

## 2020-09-27 LAB — ABO/RH: Rh Factor: NEGATIVE

## 2020-09-27 LAB — BETA HCG QUANT (REF LAB): hCG Quant: 57867 m[IU]/mL

## 2020-09-27 MED ORDER — MISOPROSTOL 200 MCG PO TABS: 800.0000 ug | ORAL_TABLET | Freq: Once | ORAL | 0 refills | Status: DC

## 2020-10-02 ENCOUNTER — Other Ambulatory Visit: Payer: Self-pay | Admitting: Advanced Practice Midwife

## 2020-10-02 ENCOUNTER — Other Ambulatory Visit: Payer: Self-pay

## 2020-10-02 ENCOUNTER — Encounter: Payer: Self-pay | Admitting: *Deleted

## 2020-10-02 ENCOUNTER — Ambulatory Visit (INDEPENDENT_AMBULATORY_CARE_PROVIDER_SITE_OTHER): Payer: 59 | Admitting: *Deleted

## 2020-10-02 DIAGNOSIS — O020 Blighted ovum and nonhydatidiform mole: Secondary | ICD-10-CM | POA: Diagnosis not present

## 2020-10-02 MED ORDER — MISOPROSTOL 200 MCG PO TABS: 800.0000 ug | ORAL_TABLET | Freq: Once | ORAL | 0 refills | Status: DC

## 2020-10-02 NOTE — Progress Notes (Signed)
   NURSE VISIT- INJECTION  SUBJECTIVE:  Rhonda Ali is a 34 y.o. G76P1001 female here for a Rhophylac for Rh neg status during pregnancy. She is [redacted]w[redacted]d pregnant.   OBJECTIVE:  LMP 08/05/2020 (Exact Date)   Appears well, in no apparent distress  Injection administered in: Left upper quad. gluteus  No orders of the defined types were placed in this encounter.   ASSESSMENT: Pregnancy [redacted]w[redacted]d Rhophylac for Rh neg status during pregnancy PLAN: Follow-up: as scheduled   Rhonda Ali  10/02/2020 10:40 AM

## 2020-10-05 ENCOUNTER — Other Ambulatory Visit: Payer: Self-pay | Admitting: Advanced Practice Midwife

## 2020-10-05 DIAGNOSIS — O020 Blighted ovum and nonhydatidiform mole: Secondary | ICD-10-CM

## 2020-10-05 MED ORDER — MISOPROSTOL 200 MCG PO TABS: 800.0000 ug | ORAL_TABLET | Freq: Once | ORAL | 0 refills | Status: DC

## 2020-10-06 LAB — BETA HCG QUANT (REF LAB): hCG Quant: 30723 m[IU]/mL

## 2020-10-09 ENCOUNTER — Encounter: Payer: Self-pay | Admitting: Adult Health

## 2020-10-09 ENCOUNTER — Other Ambulatory Visit: Payer: Self-pay

## 2020-10-09 ENCOUNTER — Ambulatory Visit (INDEPENDENT_AMBULATORY_CARE_PROVIDER_SITE_OTHER): Payer: 59 | Admitting: Adult Health

## 2020-10-09 VITALS — BP 133/71 | HR 77 | Ht 63.0 in | Wt 169.8 lb

## 2020-10-09 DIAGNOSIS — Z319 Encounter for procreative management, unspecified: Secondary | ICD-10-CM

## 2020-10-09 DIAGNOSIS — O020 Blighted ovum and nonhydatidiform mole: Secondary | ICD-10-CM

## 2020-10-09 NOTE — Progress Notes (Signed)
  Subjective:     Patient ID: Rhonda Ali, female   DOB: 27-Dec-1985, 34 y.o.   MRN: [id number]  HPI Rhonda Ali is a 34 year old white female, married, G2P1001, back in follow up of having a blighted ovum, she received rhophylac 11/29 and a QHCG was 30,723 on 10/05/20, she has had no bleeding. PCP is Dr Sherwood Gambler.  Review of Systems No bleeding Reviewed past medical,surgical, social and family history. Reviewed medications and allergies.     Objective:   Physical Exam  BP 133/71 (BP Location: Right Arm, Patient Position: Sitting, Cuff Size: Normal)   Pulse 77   Wt 169 lb 12.8 oz (77 kg)   LMP 08/05/2020 (Exact Date)   Breastfeeding No   BMI 30.08 kg/m   Skin warm and dry.  Lungs: clear to ausculation bilaterally. Cardiovascular: regular rate and rhythm.   Abdomen is soft and non tender. AA is 1  PHQ 9 score is 1   Upstream - 10/09/20 1137      Pregnancy Intention Screening   Does the patient want to become pregnant in the next year? Yes    Does the patient's partner want to become pregnant in the next year? Yes    Would the patient like to discuss contraceptive options today? No      Contraception Wrap Up   Current Method Pregnant/Seeking Pregnancy    End Method Pregnant/Seeking Pregnancy    Contraception Counseling Provided No          Assessment:     1. Blighted ovum Check QHCG today  2. Patient desires pregnancy Continue PNV Decrease smoking     Plan:    Will talk in am Follow up prn

## 2020-10-10 ENCOUNTER — Other Ambulatory Visit: Payer: Self-pay | Admitting: Adult Health

## 2020-10-10 DIAGNOSIS — O020 Blighted ovum and nonhydatidiform mole: Secondary | ICD-10-CM

## 2020-10-10 LAB — BETA HCG QUANT (REF LAB): hCG Quant: 14402 m[IU]/mL

## 2020-10-10 NOTE — Progress Notes (Signed)
Ck QHCG in 2 weeks  

## 2020-12-13 ENCOUNTER — Ambulatory Visit (INDEPENDENT_AMBULATORY_CARE_PROVIDER_SITE_OTHER): Payer: 59 | Admitting: *Deleted

## 2020-12-13 ENCOUNTER — Other Ambulatory Visit: Payer: Self-pay

## 2020-12-13 ENCOUNTER — Encounter: Payer: Self-pay | Admitting: *Deleted

## 2020-12-13 DIAGNOSIS — Z3201 Encounter for pregnancy test, result positive: Secondary | ICD-10-CM

## 2020-12-13 NOTE — Progress Notes (Signed)
Patient presented to the office for a pregnancy test. Patient states that she had a blighted ovum in November and has not had a normal period since then. Pt had positive pregnancy test at home. Has been bleeding and passing clots since last Thursday. Her last hcg was drawn on 10/09/20 and was 14,402. I discussed patient with Victorino Dike and she recommends that patient have an hcg drawn. Disussed with patient and she agrees to go to Lab to have drawn and we will discuss tomorrow.

## 2020-12-13 NOTE — Progress Notes (Signed)
Chart reviewed for nurse visit. Agree with plan of care.  Adline Potter, NP 12/13/2020 2:19 PM

## 2020-12-15 ENCOUNTER — Other Ambulatory Visit: Payer: Self-pay | Admitting: Adult Health

## 2020-12-15 DIAGNOSIS — Z3201 Encounter for pregnancy test, result positive: Secondary | ICD-10-CM

## 2020-12-15 LAB — BETA HCG QUANT (REF LAB): hCG Quant: 13 m[IU]/mL

## 2020-12-19 LAB — BETA HCG QUANT (REF LAB): hCG Quant: 3 m[IU]/mL

## 2021-04-10 ENCOUNTER — Ambulatory Visit (INDEPENDENT_AMBULATORY_CARE_PROVIDER_SITE_OTHER): Payer: 59 | Admitting: *Deleted

## 2021-04-10 VITALS — BP 135/81 | HR 79 | Ht 65.0 in | Wt 164.0 lb

## 2021-04-10 DIAGNOSIS — Z3201 Encounter for pregnancy test, result positive: Secondary | ICD-10-CM

## 2021-04-10 LAB — POCT URINE PREGNANCY: Preg Test, Ur: POSITIVE — AB

## 2021-04-10 NOTE — Progress Notes (Signed)
   NURSE VISIT- PREGNANCY CONFIRMATION   SUBJECTIVE:  Rhonda Ali is a 35 y.o. G50P1011 female at [redacted]w[redacted]d by certain LMP of Patient's last menstrual period was 03/04/2021. Here for pregnancy confirmation.  Home pregnancy test: positive x 2  She reports nausea.  She is taking prenatal vitamins.    OBJECTIVE:  BP 135/81 (BP Location: Right Arm, Patient Position: Sitting, Cuff Size: Normal)   Pulse 79   Ht 5\' 5"  (1.651 m)   Wt 164 lb (74.4 kg)   LMP 03/04/2021   BMI 27.29 kg/m   Appears well, in no apparent distress OB History  Gravida Para Term Preterm AB Living  3 1 1   1 1   SAB IAB Ectopic Multiple Live Births  1       1    # Outcome Date GA Lbr Len/2nd Weight Sex Delivery Anes PTL Lv  3 Current           2 Term 06/01/09 [redacted]w[redacted]d  7 lb 13 oz (3.544 kg) M Vag-Spont EPI  LIV  1 SAB             Results for orders placed or performed in visit on 04/10/21 (from the past 24 hour(s))  POCT urine pregnancy   Collection Time: 04/10/21  9:57 AM  Result Value Ref Range   Preg Test, Ur Positive (A) Negative    ASSESSMENT: Positive pregnancy test, [redacted]w[redacted]d by LMP    PLAN: Schedule for dating ultrasound in 2-3 weeks Prenatal vitamins: continue   Nausea medicines: has bonjesta if needed.   OB packet given: Yes  06/10/21  04/10/2021 9:58 AM

## 2021-04-10 NOTE — Progress Notes (Signed)
Chart reviewed for nurse visit. Agree with plan of care.  Adline Potter, NP 04/10/2021 12:14 PM

## 2021-04-25 ENCOUNTER — Other Ambulatory Visit: Payer: Self-pay | Admitting: Obstetrics & Gynecology

## 2021-04-25 DIAGNOSIS — O3680X0 Pregnancy with inconclusive fetal viability, not applicable or unspecified: Secondary | ICD-10-CM

## 2021-04-26 ENCOUNTER — Ambulatory Visit (INDEPENDENT_AMBULATORY_CARE_PROVIDER_SITE_OTHER): Payer: 59

## 2021-04-26 ENCOUNTER — Other Ambulatory Visit: Payer: Self-pay

## 2021-04-26 DIAGNOSIS — O3680X0 Pregnancy with inconclusive fetal viability, not applicable or unspecified: Secondary | ICD-10-CM

## 2021-04-26 DIAGNOSIS — Z3A01 Less than 8 weeks gestation of pregnancy: Secondary | ICD-10-CM | POA: Diagnosis not present

## 2021-04-26 NOTE — Progress Notes (Signed)
[place] 7+4 wks,single IUP with YS,CRL 12.25 mm,FHR 150 bpm,normal left ovary,simple right corpus luteal cyst 3.3 x 2.9 x 3.1 cm

## 2021-05-22 ENCOUNTER — Encounter: Payer: Self-pay | Admitting: Advanced Practice Midwife

## 2021-05-22 DIAGNOSIS — Z3490 Encounter for supervision of normal pregnancy, unspecified, unspecified trimester: Secondary | ICD-10-CM | POA: Insufficient documentation

## 2021-05-25 ENCOUNTER — Other Ambulatory Visit: Payer: Self-pay | Admitting: Obstetrics & Gynecology

## 2021-05-25 DIAGNOSIS — Z3682 Encounter for antenatal screening for nuchal translucency: Secondary | ICD-10-CM

## 2021-05-28 ENCOUNTER — Ambulatory Visit (INDEPENDENT_AMBULATORY_CARE_PROVIDER_SITE_OTHER): Payer: 59

## 2021-05-28 ENCOUNTER — Ambulatory Visit: Payer: 59 | Admitting: *Deleted

## 2021-05-28 ENCOUNTER — Encounter: Payer: Self-pay | Admitting: Advanced Practice Midwife

## 2021-05-28 ENCOUNTER — Other Ambulatory Visit: Payer: Self-pay

## 2021-05-28 ENCOUNTER — Ambulatory Visit (INDEPENDENT_AMBULATORY_CARE_PROVIDER_SITE_OTHER): Payer: 59 | Admitting: Advanced Practice Midwife

## 2021-05-28 VITALS — BP 137/78 | HR 82 | Wt 163.0 lb

## 2021-05-28 DIAGNOSIS — Z3682 Encounter for antenatal screening for nuchal translucency: Secondary | ICD-10-CM | POA: Diagnosis not present

## 2021-05-28 DIAGNOSIS — O26899 Other specified pregnancy related conditions, unspecified trimester: Secondary | ICD-10-CM

## 2021-05-28 DIAGNOSIS — Z3400 Encounter for supervision of normal first pregnancy, unspecified trimester: Secondary | ICD-10-CM

## 2021-05-28 DIAGNOSIS — Z3A12 12 weeks gestation of pregnancy: Secondary | ICD-10-CM

## 2021-05-28 DIAGNOSIS — Z3480 Encounter for supervision of other normal pregnancy, unspecified trimester: Secondary | ICD-10-CM

## 2021-05-28 DIAGNOSIS — Z6791 Unspecified blood type, Rh negative: Secondary | ICD-10-CM | POA: Insufficient documentation

## 2021-05-28 LAB — POCT URINALYSIS DIPSTICK OB
Blood, UA: NEGATIVE
Glucose, UA: NEGATIVE
Ketones, UA: NEGATIVE
Leukocytes, UA: NEGATIVE
Nitrite, UA: NEGATIVE
POC,PROTEIN,UA: NEGATIVE

## 2021-05-28 LAB — OB RESULTS CONSOLE GBS: GBS: POSITIVE

## 2021-05-28 NOTE — Progress Notes (Signed)
Subjective:   Rhonda Ali is a 35 y.o. G3P1011 at [redacted]w[redacted]d by 10 week [place] early ultrasound being seen today for her first obstetrical visit.    Gynecological/Obstetrical History: Her obstetrical history is significant for  none . Patient does intend to breast feed. Pregnancy history fully reviewed. Patient reports nausea. Sexual Activity and Vaginal Concerns  Medical History/ROS:   Social History: Patient denies current usage of tobacco, alcohol, or drugs.  Patient reports the FOB is [employer], and they are married.  Patient reports that she lives with Di Kindle (33 year old) and endorses safety at home.  Patient denies DV/A. Patient is currently employed at [employer] in the office.  HISTORY: OB History  Gravida Para Term Preterm AB Living  3 1 1  0 1 1  SAB IAB Ectopic Multiple Live Births  1 0 0 0 1    # Outcome Date GA Lbr Len/2nd Weight Sex Delivery Anes PTL Lv  3 Current           2 SAB 09/26/20             Birth Comments: blighted ovum  1 Term 06/01/09 [redacted]w[redacted]d  7 lb 13 oz (3.544 kg) M Vag-Spont EPI N LIV    Last pap smear was done 09/2020 and was normal  Past Medical History:  Diagnosis Date   Medical history non-contributory    Moodiness 04/27/2013   Nausea & vomiting 04/27/2013   Past Surgical History:  Procedure Laterality Date   MOUTH SURGERY     Family History  Problem Relation Age of Onset   High Cholesterol Mother    Healthy Father    Heart disease Maternal Grandmother    Hypertension Maternal Grandmother    Cancer Maternal Grandfather        melanoma   Stroke Maternal Grandfather    Social History   Tobacco Use   Smoking status: Former    Packs/day: 0.50    Years: 4.00    Pack years: 2.00    Types: Cigarettes   Smokeless tobacco: Never  Vaping Use   Vaping Use: Never used  Substance Use Topics   Alcohol use: No   Drug use: No    Types: Marijuana   No Known Allergies Current Outpatient Medications on File Prior to Visit   Medication Sig Dispense Refill   Prenatal Vit-Fe Fumarate-FA (PRENATAL VITAMINS) 28-0.8 MG TABS Take by mouth.     No current facility-administered medications on file prior to visit.    Review of Systems Pertinent items noted in HPI and remainder of comprehensive ROS otherwise negative.  Exam   Vitals:   05/28/21 0831  BP: 137/78  Pulse: 82  Weight: 163 lb (73.9 kg)   Fetal Heart Rate (bpm): 160  Physical Exam Constitutional:      General: She is not in acute distress. HENT:     Head: Normocephalic.  Eyes:     Pupils: Pupils are equal, round, and reactive to light.  Cardiovascular:     Rate and Rhythm: Normal rate.  Pulmonary:     Effort: Pulmonary effort is normal.  Abdominal:     Palpations: Abdomen is soft.     Tenderness: There is no abdominal tenderness.  Neurological:     Mental Status: She is alert and oriented to person, place, and time.  Skin:    General: Skin is warm and dry.  Psychiatric:        Mood and Affect: Mood normal.  Behavior: Behavior normal.  Vitals and nursing note reviewed.    Assessment:   35 y.o. year old G26P1021 Patient Active Problem List   Diagnosis Date Noted   Rh negative state in antepartum period 05/28/2021   Encounter for supervision of normal pregnancy, antepartum 05/22/2021   Smoker 06/27/2017     Plan:  1. Supervision of other normal pregnancy, antepartum - routine care - Panorama/Horizon today   2. Rh negative state in antepartum period - Will need rhogam at 28 weeks   3. [redacted] weeks gestation of pregnancy      Problem list reviewed and updated. Routine obstetric precautions reviewed.  Orders Placed This Encounter  Procedures   GC/Chlamydia Probe Amp   Urine Culture   Integrated 1    Order Specific Question:   Is patient insulin dependent?    Answer:   No    Order Specific Question:   Patient race?    Answer:   Caucasian    Order Specific Question:   Weight (lbs)    Answer:   163    Order  Specific Question:   Number of fetuses    Answer:   1    Order Specific Question:   Additional information?    Answer:   nasal bone present    Order Specific Question:   Crown rump length (mm)?    Answer:   53.86    Order Specific Question:   Crown rump length date?    Answer:   05/28/2021    Order Specific Question:   Nuchal translucency (mm)    Answer:   1    Order Specific Question:   Donor egg?    Answer:   N    Order Specific Question:   Sonographer last name    Answer:   Baldo Ash    Order Specific Question:   Sonographer first name    Answer:   Hydrologist Specific Question:   Sonographer ID number    Answer:   [id number]    Order Specific Question:   Credentialed by    Answer:   NTQR   CBC/D/Plt+RPR+Rh+ABO+RubIgG...   Genetic Screening    Panorama/Horizon   Pain Management Screening Profile (10S)   POC Urinalysis Dipstick OB    Return in about 4 weeks (around 06/25/2021).     Thressa Sheller DNP, CNM  05/28/21  10:04 AM

## 2021-05-28 NOTE — Progress Notes (Signed)
[place] 12+1 wks,measurements c/w dates,crl 56.86 mm,NB present,NT 1 mm,normal ovaries,FHR 164 bpm

## 2021-05-29 LAB — PMP SCREEN PROFILE (10S), URINE
Amphetamine Scrn, Ur: NEGATIVE ng/mL
BARBITURATE SCREEN URINE: NEGATIVE ng/mL
BENZODIAZEPINE SCREEN, URINE: NEGATIVE ng/mL
CANNABINOIDS UR QL SCN: POSITIVE ng/mL — AB
Cocaine (Metab) Scrn, Ur: NEGATIVE ng/mL
Creatinine(Crt), U: 120 mg/dL (ref 20.0–300.0)
Methadone Screen, Urine: NEGATIVE ng/mL
OXYCODONE+OXYMORPHONE UR QL SCN: NEGATIVE ng/mL
Opiate Scrn, Ur: NEGATIVE ng/mL
Ph of Urine: 6.6 (ref 4.5–8.9)
Phencyclidine Qn, Ur: NEGATIVE ng/mL
Propoxyphene Scrn, Ur: NEGATIVE ng/mL

## 2021-05-30 LAB — INTEGRATED 1
Crown Rump Length: 56.9 mm
Gest. Age on Collection Date: 12 weeks
Maternal Age at EDD: 33.7 yr
Nuchal Translucency (NT): 1 mm
Number of Fetuses: 1
PAPP-A Value: 682.5 ng/mL
Weight: 163 [lb_av]

## 2021-05-30 LAB — CBC/D/PLT+RPR+RH+ABO+RUBIGG...
Antibody Screen: NEGATIVE
Basophils Absolute: 0 10*3/uL (ref 0.0–0.2)
Basos: 0 %
EOS (ABSOLUTE): 0 10*3/uL (ref 0.0–0.4)
Eos: 0 %
HCV Ab: <0.1 s/co ratio (ref 0.0–0.9)
HIV Screen 4th Generation wRfx: NONREACTIVE
Hematocrit: 37.5 % (ref 34.0–46.6)
Hemoglobin: 12.2 g/dL (ref 11.1–15.9)
Hepatitis B Surface Ag: NEGATIVE
Immature Grans (Abs): 0.1 10*3/uL (ref 0.0–0.1)
Immature Granulocytes: 1 %
Lymphocytes Absolute: 1.3 10*3/uL (ref 0.7–3.1)
Lymphs: 13 %
MCH: 26.8 pg (ref 26.6–33.0)
MCHC: 32.5 g/dL (ref 31.5–35.7)
MCV: 82 fL (ref 79–97)
Monocytes Absolute: 0.7 10*3/uL (ref 0.1–0.9)
Monocytes: 7 %
Neutrophils Absolute: 7.3 10*3/uL — ABNORMAL HIGH (ref 1.4–7.0)
Neutrophils: 79 %
Platelets: 254 10*3/uL (ref 150–450)
RBC: 4.55 x10E6/uL (ref 3.77–5.28)
RDW: 14.5 % (ref 11.7–15.4)
RPR Ser Ql: NONREACTIVE
Rh Factor: NEGATIVE
Rubella Antibodies, IGG: 3.52 index (ref >0.99)
WBC: 9.3 10*3/uL (ref 3.4–10.8)

## 2021-05-30 LAB — HCV INTERPRETATION

## 2021-05-31 LAB — URINE CULTURE

## 2021-05-31 MED ORDER — AMOXICILLIN 500 MG PO CAPS: 500.0000 mg | ORAL_CAPSULE | Freq: Two times a day (BID) | ORAL | 0 refills | Status: AC

## 2021-06-18 ENCOUNTER — Encounter: Payer: Self-pay | Admitting: Women's Health

## 2021-06-26 ENCOUNTER — Ambulatory Visit (INDEPENDENT_AMBULATORY_CARE_PROVIDER_SITE_OTHER): Payer: 59 | Admitting: Women's Health

## 2021-06-26 ENCOUNTER — Encounter: Payer: Self-pay | Admitting: Women's Health

## 2021-06-26 ENCOUNTER — Other Ambulatory Visit: Payer: Self-pay

## 2021-06-26 VITALS — BP 124/72 | HR 76 | Wt 169.0 lb

## 2021-06-26 DIAGNOSIS — Z3482 Encounter for supervision of other normal pregnancy, second trimester: Secondary | ICD-10-CM

## 2021-06-26 DIAGNOSIS — Z3A16 16 weeks gestation of pregnancy: Secondary | ICD-10-CM

## 2021-06-26 DIAGNOSIS — Z363 Encounter for antenatal screening for malformations: Secondary | ICD-10-CM

## 2021-06-26 DIAGNOSIS — Z3480 Encounter for supervision of other normal pregnancy, unspecified trimester: Secondary | ICD-10-CM

## 2021-06-26 NOTE — Progress Notes (Signed)
LOW-RISK PREGNANCY VISIT Patient name: Rhonda Ali MRN [id number]  Date of birth: [birth year] Chief Complaint:   Routine Prenatal Visit  History of Present Illness:   Rhonda Ali is a 35 y.o. G1P1011 female at [redacted]w[redacted]d with an Estimated Date of Delivery: 12/09/21 being seen today for ongoing management of a low-risk pregnancy.   Today she reports headaches, CoQ10 and APAP help. Contractions: Not present. Vag. Bleeding: None.  Movement: Absent. denies leaking of fluid.  Depression screen [facility] 2/9 05/28/2021 09/11/2020 05/27/2017  Decreased Interest 0 0 0  Down, Depressed, Hopeless 0 0 0  PHQ - 2 Score 0 0 0  Altered sleeping 1 0 -  Tired, decreased energy 1 1 -  Change in appetite 0 0 -  Feeling bad or failure about yourself  0 0 -  Trouble concentrating 0 0 -  Moving slowly or fidgety/restless 0 0 -  Suicidal thoughts 0 0 -  PHQ-9 Score 2 1 -     GAD 7 : Generalized Anxiety Score 05/28/2021 09/11/2020  Nervous, Anxious, on Edge 1 1  Control/stop worrying 0 0  Worry too much - different things 1 0  Trouble relaxing 0 0  Restless 0 0  Easily annoyed or irritable 0 0  Afraid - awful might happen 0 0  Total GAD 7 Score 2 1      Review of Systems:   Pertinent items are noted in HPI Denies abnormal vaginal discharge w/ itching/odor/irritation, headaches, visual changes, shortness of breath, chest pain, abdominal pain, severe nausea/vomiting, or problems with urination or bowel movements unless otherwise stated above. Pertinent History Reviewed:  Reviewed past medical,surgical, social, obstetrical and family history.  Reviewed problem list, medications and allergies. Physical Assessment:   Vitals:   06/26/21 0834  BP: 124/72  Pulse: 76  Weight: 169 lb (76.7 kg)  Body mass index is 28.12 kg/m.        Physical Examination:   General appearance: Well appearing, and in no distress  Mental status: Alert, oriented to person, place, and time  Skin: Warm &  dry  Cardiovascular: Normal heart rate noted  Respiratory: Normal respiratory effort, no distress  Abdomen: Soft, gravid, nontender  Pelvic: Cervical exam deferred         Extremities: Edema: None  Fetal Status: Fetal Heart Rate (bpm): 150   Movement: Absent    Chaperone: N/A   No results found for this or any previous visit (from the past 24 hour(s)).  Assessment & Plan:  1) Low-risk pregnancy G3P1011 at [redacted]w[redacted]d with an Estimated Date of Delivery: 12/09/21   2) Headaches, current relief measures working, let us know if stops helping  3) GBS bacteruria> POC urine cx today    Meds: No orders of the defined types were placed in this encounter.  Labs/procedures today: GC/CT, urine culture, and 2nd IT  Plan:  Continue routine obstetrical care  Next visit: prefers will be in person for u/s     Reviewed: Preterm labor symptoms and general obstetric precautions including but not limited to vaginal bleeding, contractions, leaking of fluid and fetal movement were reviewed in detail with the patient.  All questions were answered. Does have home bp cuff. Office bp cuff given: not applicable. Check bp weekly, let us know if consistently >140 and/or >90.  Follow-up: Return in about 3 weeks (around 07/17/2021) for LROB, JA:[id number], CNM, in person.  No future appointments.  Orders Placed This Encounter  Procedures   Urine Culture   US  OB Comp + 14 Wk   INTEGRATED 2   Cheral Marker CNM, [facility] 06/26/2021 8:53 AM

## 2021-06-26 NOTE — Patient Instructions (Signed)
Rhonda Ali, thank you for choosing our office today! We appreciate the opportunity to meet your healthcare needs. You may receive a short survey by mail, e-mail, or through [employer]. If you are happy with your care we would appreciate if you could take just a few minutes to complete the survey questions. We read all of your comments and take your feedback very seriously. Thank you again for choosing our office.  Center for [employer] Team at [facility] at [facility] ([address] [place], [place] [address]) Entrance C, located off of [address] Free 24/7 valet parking  Go to [email or web address] to register for FREE online childbirth classes  Call the office [phone]) or go to [facility] if: You begin to severe cramping Your water breaks.  Sometimes it is a big gush of fluid, sometimes it is just a trickle that keeps getting your panties wet or running down your legs You have vaginal bleeding.  It is normal to have a small amount of spotting if your cervix was checked.   [facility] Pediatricians/Family Doctors [facility]): [address] Colette Ribas, [phone]           [facility]: [address], [phone]                [facility]): [address], [phone] (call to ask if accepting patients) [facility] Department: [address], [place], [phone]    [facility] Pediatricians/Family Doctors [facility]): [phone] [address], [phone] [facility]: [address] [place], [phone] [facility]: [address], [phone]  [facility] Doctors  [facility]): [phone] [facility]: [address] [phone]   [facility] Doctors [facility]: [address], [phone]  [facility] Doctors  [facility]: [phone], [phone]  Home Blood Pressure Monitoring for Patients   Your provider has recommended that you check your blood pressure (BP) at least once a week at home. If you do not have a blood pressure cuff at home, one will be provided for you. Contact your provider if you have not received your monitor within 1 week.   Helpful Tips for Accurate Home Blood Pressure Checks  Don't smoke, exercise, or drink caffeine 30 minutes before checking your BP Use the restroom before checking your BP (a full bladder can raise your pressure) Relax in a comfortable upright chair Feet on the ground Left arm resting comfortably on a flat surface at the level of your heart Legs uncrossed Back supported Sit quietly and don't talk Place the cuff on your bare arm Adjust snuggly, so that only two fingertips can fit between your skin and the top of the cuff Check 2 readings separated by at least one minute Keep a log of your BP readings For a visual, please reference this diagram: http://ccnc.care/bpdiagram  Provider Name: [facility]     Phone: [phone]  Zone 1: ALL CLEAR  Continue to monitor your symptoms:  BP reading is less than 140 (top number) or less than 90 (bottom number)  No right upper stomach pain No headaches or seeing spots No feeling nauseated or throwing up No swelling in face and hands  Zone 2: CAUTION Call your doctor's office for any of the following:  BP reading is greater than 140 (top number) or greater than  90 (bottom number)  Stomach pain under your ribs in the middle or right side Headaches or seeing spots Feeling nauseated or throwing up Swelling in face and hands  Zone 3: EMERGENCY  Seek immediate medical care if you have any of the following:  BP reading is greater than160 (top number) or greater than 110 (bottom number) Severe headaches not improving with Tylenol Serious difficulty catching your breath Any worsening symptoms from  Zone 2     Second Trimester of Pregnancy The second trimester is from week 14 through week 27 (months 4 through 6). The second trimester is often a time when you feel your best. Your body has adjusted to being pregnant, and you begin to feel better physically. Usually, morning sickness has lessened or quit completely, you may have more energy, and you may have an increase in appetite. The second trimester is also a time when the fetus is growing rapidly. At the end of the sixth month, the fetus is about 9 inches long and weighs about 1 pounds. You will likely begin to feel the baby move (quickening) between 16 and 20 weeks of pregnancy. Body changes during your second trimester Your body continues to go through many changes during your second trimester. The changes vary from woman to woman. Your weight will continue to increase. You will notice your lower abdomen bulging out. You may begin to get stretch marks on your hips, abdomen, and breasts. You may develop headaches that can be relieved by medicines. The medicines should be approved by your health care provider. You may urinate more often because the fetus is pressing on your bladder. You may develop or continue to have heartburn as a result of your pregnancy. You may develop constipation because certain hormones are causing the muscles that push waste through your intestines to slow down. You may develop hemorrhoids or swollen, bulging veins (varicose veins). You may have back pain. This is caused by: Weight gain. Pregnancy hormones that are relaxing the joints in your pelvis. A shift in weight and the muscles that support your balance. Your breasts will continue to grow and they will continue to become tender. Your gums may bleed and may be sensitive to brushing and flossing. Dark spots or blotches (chloasma, mask of pregnancy) may develop on your face. This will likely fade after the baby is born. A dark line from your belly button to  the pubic area (linea nigra) may appear. This will likely fade after the baby is born. You may have changes in your hair. These can include thickening of your hair, rapid growth, and changes in texture. Some women also have hair loss during or after pregnancy, or hair that feels dry or thin. Your hair will most likely return to normal after your baby is born.  What to expect at prenatal visits During a routine prenatal visit: You will be weighed to make sure you and the fetus are growing normally. Your blood pressure will be taken. Your abdomen will be measured to track your baby's growth. The fetal heartbeat will be listened to. Any test results from the previous visit will be discussed.  Your health care provider may ask you: How you are feeling. If you are feeling the baby move. If you have had any abnormal symptoms, such as leaking fluid, bleeding, severe headaches, or abdominal cramping. If you are using any tobacco products, including cigarettes, chewing tobacco, and electronic cigarettes. If you have any questions.  Other tests that may be performed during   your second trimester include: Blood tests that check for: Low iron levels (anemia). High blood sugar that affects pregnant women (gestational diabetes) between 24 and 28 weeks. Rh antibodies. This is to check for a protein on red blood cells (Rh factor). Urine tests to check for infections, diabetes, or protein in the urine. An ultrasound to confirm the proper growth and development of the baby. An amniocentesis to check for possible genetic problems. Fetal screens for spina bifida and Down syndrome. HIV (human immunodeficiency virus) testing. Routine prenatal testing includes screening for HIV, unless you choose not to have this test.  Follow these instructions at home: Medicines Follow your health care provider's instructions regarding medicine use. Specific medicines may be either safe or unsafe to take during  pregnancy. Take a prenatal vitamin that contains at least 600 micrograms (mcg) of folic acid. If you develop constipation, try taking a stool softener if your health care provider approves. Eating and drinking Eat a balanced diet that includes fresh fruits and vegetables, whole grains, good sources of protein such as meat, eggs, or tofu, and low-fat dairy. Your health care provider will help you determine the amount of weight gain that is right for you. Avoid raw meat and uncooked cheese. These carry germs that can cause birth defects in the baby. If you have low calcium intake from food, talk to your health care provider about whether you should take a daily calcium supplement. Limit foods that are high in fat and processed sugars, such as fried and sweet foods. To prevent constipation: Drink enough fluid to keep your urine clear or pale yellow. Eat foods that are high in fiber, such as fresh fruits and vegetables, whole grains, and beans. Activity Exercise only as directed by your health care provider. Most women can continue their usual exercise routine during pregnancy. Try to exercise for 30 minutes at least 5 days a week. Stop exercising if you experience uterine contractions. Avoid heavy lifting, wear low heel shoes, and practice good posture. A sexual relationship may be continued unless your health care provider directs you otherwise. Relieving pain and discomfort Wear a good support bra to prevent discomfort from breast tenderness. Take warm sitz baths to soothe any pain or discomfort caused by hemorrhoids. Use hemorrhoid cream if your health care provider approves. Rest with your legs elevated if you have leg cramps or low back pain. If you develop varicose veins, wear support hose. Elevate your feet for 15 minutes, 3-4 times a day. Limit salt in your diet. Prenatal Care Write down your questions. Take them to your prenatal visits. Keep all your prenatal visits as told by your health  care provider. This is important. Safety Wear your seat belt at all times when driving. Make a list of emergency phone numbers, including numbers for family, friends, the hospital, and police and fire departments. General instructions Ask your health care provider for a referral to a local prenatal education class. Begin classes no later than the beginning of month 6 of your pregnancy. Ask for help if you have counseling or nutritional needs during pregnancy. Your health care provider can offer advice or refer you to specialists for help with various needs. Do not use hot tubs, steam rooms, or saunas. Do not douche or use tampons or scented sanitary pads. Do not cross your legs for long periods of time. Avoid cat litter boxes and soil used by cats. These carry germs that can cause birth defects in the baby and possibly loss of the   fetus by miscarriage or stillbirth. Avoid all smoking, herbs, alcohol, and unprescribed drugs. Chemicals in these products can affect the formation and growth of the baby. Do not use any products that contain nicotine or tobacco, such as cigarettes and e-cigarettes. If you need help quitting, ask your health care provider. Visit your dentist if you have not gone yet during your pregnancy. Use a soft toothbrush to brush your teeth and be gentle when you floss. Contact a health care provider if: You have dizziness. You have mild pelvic cramps, pelvic pressure, or nagging pain in the abdominal area. You have persistent nausea, vomiting, or diarrhea. You have a bad smelling vaginal discharge. You have pain when you urinate. Get help right away if: You have a fever. You are leaking fluid from your vagina. You have spotting or bleeding from your vagina. You have severe abdominal cramping or pain. You have rapid weight gain or weight loss. You have shortness of breath with chest pain. You notice sudden or extreme swelling of your face, hands, ankles, feet, or legs. You  have not felt your baby move in over an hour. You have severe headaches that do not go away when you take medicine. You have vision changes. Summary The second trimester is from week 14 through week 27 (months 4 through 6). It is also a time when the fetus is growing rapidly. Your body goes through many changes during pregnancy. The changes vary from woman to woman. Avoid all smoking, herbs, alcohol, and unprescribed drugs. These chemicals affect the formation and growth your baby. Do not use any tobacco products, such as cigarettes, chewing tobacco, and e-cigarettes. If you need help quitting, ask your health care provider. Contact your health care provider if you have any questions. Keep all prenatal visits as told by your health care provider. This is important. This information is not intended to replace advice given to you by your health care provider. Make sure you discuss any questions you have with your health care provider. Document Released: 10/15/2001 Document Revised: 03/28/2016 Document Reviewed: 12/22/2012 Elsevier Interactive Patient Education  2017 [employer]  

## 2021-06-28 LAB — INTEGRATED 2
AFP MoM: 1.25
Alpha-Fetoprotein: 39.8 ng/mL
Crown Rump Length: 56.9 mm
DIA MoM: 0.74
DIA Value: 112 pg/mL
Estriol, Unconjugated: 0.96 ng/mL
Gest. Age on Collection Date: 12 weeks
Gestational Age: 16.1 weeks
Maternal Age at EDD: 33.7 yr
Nuchal Translucency (NT): 1 mm
Nuchal Translucency MoM: 0.79
Number of Fetuses: 1
PAPP-A MoM: 0.93
PAPP-A Value: 682.5 ng/mL
Test Results:: NEGATIVE
Weight: 163 [lb_av]
Weight: 163 [lb_av]
hCG MoM: 1.42
hCG Value: 50.9 IU/mL
uE3 MoM: 1

## 2021-06-28 LAB — URINE CULTURE

## 2021-06-29 ENCOUNTER — Other Ambulatory Visit: Payer: Self-pay | Admitting: Adult Health

## 2021-06-29 LAB — GC/CHLAMYDIA PROBE AMP
Chlamydia trachomatis, NAA: NEGATIVE
Neisseria Gonorrhoeae by PCR: NEGATIVE

## 2021-06-29 MED ORDER — PENICILLIN V POTASSIUM 500 MG PO TABS: 500.0000 mg | ORAL_TABLET | Freq: Four times a day (QID) | ORAL | 0 refills | Status: DC

## 2021-06-29 NOTE — Progress Notes (Signed)
Will rx penicillin for UTI

## 2021-07-02 ENCOUNTER — Encounter: Payer: Self-pay | Admitting: Women's Health

## 2021-07-02 DIAGNOSIS — R8271 Bacteriuria: Secondary | ICD-10-CM | POA: Insufficient documentation

## 2021-07-03 ENCOUNTER — Telehealth: Payer: Self-pay | Admitting: Women's Health

## 2021-07-03 NOTE — Telephone Encounter (Signed)
Returned pt's call for clarification. Pt stated that there was a small amount of blood mixed in with the phlegm. She had been coughing a lot and has a sore throat. She was instructed to try some OTC meds on her safe baby list, along with some cough syrup and cough drops. Pt stated she will try those. Pt was reassured that all was well and she will call back with any other issues.

## 2021-07-03 NOTE — Telephone Encounter (Signed)
Getting over Covid, had a bad coughing spell - coughed up some blood, red, mixed with some yellow phlem (just the one time today)   Should she be concerned - please advise

## 2021-07-20 ENCOUNTER — Encounter: Payer: Self-pay | Admitting: Women's Health

## 2021-07-20 ENCOUNTER — Ambulatory Visit (INDEPENDENT_AMBULATORY_CARE_PROVIDER_SITE_OTHER): Payer: 59

## 2021-07-20 ENCOUNTER — Other Ambulatory Visit: Payer: Self-pay

## 2021-07-20 ENCOUNTER — Ambulatory Visit (INDEPENDENT_AMBULATORY_CARE_PROVIDER_SITE_OTHER): Payer: 59 | Admitting: Women's Health

## 2021-07-20 VITALS — BP 125/71 | HR 77 | Wt 175.4 lb

## 2021-07-20 DIAGNOSIS — Z3A19 19 weeks gestation of pregnancy: Secondary | ICD-10-CM

## 2021-07-20 DIAGNOSIS — Z6791 Unspecified blood type, Rh negative: Secondary | ICD-10-CM

## 2021-07-20 DIAGNOSIS — Z3480 Encounter for supervision of other normal pregnancy, unspecified trimester: Secondary | ICD-10-CM

## 2021-07-20 DIAGNOSIS — R8271 Bacteriuria: Secondary | ICD-10-CM

## 2021-07-20 DIAGNOSIS — Z363 Encounter for antenatal screening for malformations: Secondary | ICD-10-CM

## 2021-07-20 DIAGNOSIS — Z3482 Encounter for supervision of other normal pregnancy, second trimester: Secondary | ICD-10-CM

## 2021-07-20 NOTE — Patient Instructions (Signed)
Lella, thank you for choosing our office today! We appreciate the opportunity to meet your healthcare needs. You may receive a short survey by mail, e-mail, or through MyChart. If you are happy with your care we would appreciate if you could take just a few minutes to complete the survey questions. We read all of your comments and take your feedback very seriously. Thank you again for choosing our office.  [facility] Team at [facility] ([address] [place], [place] [address]) Entrance C, located off of [address] Free 24/7 valet parking  Go to [email or web address] to register for FREE online childbirth classes  Call the office ([phone]) or go to [facility] if: You begin to severe cramping Your water breaks.  Sometimes it is a big gush of fluid, sometimes it is just a trickle that keeps getting your panties wet or running down your legs You have vaginal bleeding.  It is normal to have a small amount of spotting if your cervix was checked.   [place] [facility] Doctors [facility] ([facility]): [address], [phone]           [facility]: [address], [phone]                [facility] ([facility]): [address], [phone] (call to ask if accepting patients) [facility]: [address], [place], [phone]    [place] [facility]/Family Doctors [facility] ([facility]): [address], [phone] [facility]: [address], [phone] [facility]: [address], [phone]  [place] [facility]  [facility] ([facility]): [phone] [facility]: [address], [phone]   [place] [facility]: [address], [phone]  [place] [facility]  [facility]: [address], [phone]  Home Blood Pressure Monitoring for Patients   Your provider has recommended that you check your blood pressure (BP) at least once a week at home. If you do not have a blood pressure cuff at home, one will be provided for you. Contact your provider if you have not received your monitor within 1 week.   Helpful Tips for Accurate Home Blood Pressure Checks  Don't smoke, exercise, or drink caffeine 30 minutes before checking your BP Use the restroom before checking your BP (a full bladder can raise your pressure) Relax in a comfortable upright chair Feet on the ground Left arm resting comfortably on a flat surface at the level of your heart Legs uncrossed Back supported Sit quietly and don't talk Place the cuff on your bare arm Adjust snuggly, so that only two fingertips can fit between your skin and the top of the cuff Check 2 readings separated by at least one minute Keep a log of your BP readings For a visual, please reference this diagram: http://[facility].care/bpdiagram  Provider Name: [facility]     Phone: [phone]  Zone 1: ALL CLEAR  Continue to monitor your symptoms:  BP reading is less than 140 (top number) or less than 90 (bottom number)  No right upper stomach pain No headaches or seeing spots No feeling nauseated or throwing up No swelling in face and hands  Zone 2: CAUTION Call your doctor's office for any of the following:  BP reading is greater than 140 (top number) or greater than   90 (bottom number)  Stomach pain under your ribs in the middle or right side Headaches or seeing spots Feeling nauseated or throwing up Swelling in face and hands  Zone 3: EMERGENCY  Seek immediate medical care if you have any of the following:  BP reading is greater than160 (top number) or greater than 110 (bottom number) Severe headaches not improving with Tylenol Serious difficulty catching your breath Any worsening symptoms from  Zone 2     Second Trimester of Pregnancy The second trimester is from week 14 through week 27 (months 4 through 6). The second trimester is often a time when you feel your best. Your body has adjusted to being pregnant, and you begin to feel better physically. Usually, morning sickness has lessened or quit completely, you may have more energy, and you may have an increase in appetite. The second trimester is also a time when the fetus is growing rapidly. At the end of the sixth month, the fetus is about 9 inches long and weighs about 1 pounds. You will likely begin to feel the baby move (quickening) between 16 and 20 weeks of pregnancy. Body changes during your second trimester Your body continues to go through many changes during your second trimester. The changes vary from woman to woman. Your weight will continue to increase. You will notice your lower abdomen bulging out. You may begin to get stretch marks on your hips, abdomen, and breasts. You may develop headaches that can be relieved by medicines. The medicines should be approved by your health care provider. You may urinate more often because the fetus is pressing on your bladder. You may develop or continue to have heartburn as a result of your pregnancy. You may develop constipation because certain hormones are causing the muscles that push waste through your intestines to slow down. You may develop hemorrhoids or swollen, bulging veins (varicose veins). You may have back pain. This is caused by: Weight gain. Pregnancy hormones that are relaxing the joints in your pelvis. A shift in weight and the muscles that support your balance. Your breasts will continue to grow and they will continue to become tender. Your gums may bleed and may be sensitive to brushing and flossing. Dark spots or blotches (chloasma, mask of pregnancy) may develop on your face. This will likely fade after the baby is born. A dark line from your belly button to  the pubic area (linea nigra) may appear. This will likely fade after the baby is born. You may have changes in your hair. These can include thickening of your hair, rapid growth, and changes in texture. Some women also have hair loss during or after pregnancy, or hair that feels dry or thin. Your hair will most likely return to normal after your baby is born.  What to expect at prenatal visits During a routine prenatal visit: You will be weighed to make sure you and the fetus are growing normally. Your blood pressure will be taken. Your abdomen will be measured to track your baby's growth. The fetal heartbeat will be listened to. Any test results from the previous visit will be discussed.  Your health care provider may ask you: How you are feeling. If you are feeling the baby move. If you have had any abnormal symptoms, such as leaking fluid, bleeding, severe headaches, or abdominal cramping. If you are using any tobacco products, including cigarettes, chewing tobacco, and electronic cigarettes. If you have any questions.  Other tests that may be performed during   your second trimester include: Blood tests that check for: Low iron levels (anemia). High blood sugar that affects pregnant women (gestational diabetes) between 24 and 28 weeks. Rh antibodies. This is to check for a protein on red blood cells (Rh factor). Urine tests to check for infections, diabetes, or protein in the urine. An ultrasound to confirm the proper growth and development of the baby. An amniocentesis to check for possible genetic problems. Fetal screens for spina bifida and Down syndrome. HIV (human immunodeficiency virus) testing. Routine prenatal testing includes screening for HIV, unless you choose not to have this test.  Follow these instructions at home: Medicines Follow your health care provider's instructions regarding medicine use. Specific medicines may be either safe or unsafe to take during  pregnancy. Take a prenatal vitamin that contains at least 600 micrograms (mcg) of folic acid. If you develop constipation, try taking a stool softener if your health care provider approves. Eating and drinking Eat a balanced diet that includes fresh fruits and vegetables, whole grains, good sources of protein such as meat, eggs, or tofu, and low-fat dairy. Your health care provider will help you determine the amount of weight gain that is right for you. Avoid raw meat and uncooked cheese. These carry germs that can cause birth defects in the baby. If you have low calcium intake from food, talk to your health care provider about whether you should take a daily calcium supplement. Limit foods that are high in fat and processed sugars, such as fried and sweet foods. To prevent constipation: Drink enough fluid to keep your urine clear or pale yellow. Eat foods that are high in fiber, such as fresh fruits and vegetables, whole grains, and beans. Activity Exercise only as directed by your health care provider. Most women can continue their usual exercise routine during pregnancy. Try to exercise for 30 minutes at least 5 days a week. Stop exercising if you experience uterine contractions. Avoid heavy lifting, wear low heel shoes, and practice good posture. A sexual relationship may be continued unless your health care provider directs you otherwise. Relieving pain and discomfort Wear a good support bra to prevent discomfort from breast tenderness. Take warm sitz baths to soothe any pain or discomfort caused by hemorrhoids. Use hemorrhoid cream if your health care provider approves. Rest with your legs elevated if you have leg cramps or low back pain. If you develop varicose veins, wear support hose. Elevate your feet for 15 minutes, 3-4 times a day. Limit salt in your diet. Prenatal Care Write down your questions. Take them to your prenatal visits. Keep all your prenatal visits as told by your health  care provider. This is important. Safety Wear your seat belt at all times when driving. Make a list of emergency phone numbers, including numbers for family, friends, the hospital, and police and fire departments. General instructions Ask your health care provider for a referral to a local prenatal education class. Begin classes no later than the beginning of month 6 of your pregnancy. Ask for help if you have counseling or nutritional needs during pregnancy. Your health care provider can offer advice or refer you to specialists for help with various needs. Do not use hot tubs, steam rooms, or saunas. Do not douche or use tampons or scented sanitary pads. Do not cross your legs for long periods of time. Avoid cat litter boxes and soil used by cats. These carry germs that can cause birth defects in the baby and possibly loss of the   fetus by miscarriage or stillbirth. Avoid all smoking, herbs, alcohol, and unprescribed drugs. Chemicals in these products can affect the formation and growth of the baby. Do not use any products that contain nicotine or tobacco, such as cigarettes and e-cigarettes. If you need help quitting, ask your health care provider. Visit your dentist if you have not gone yet during your pregnancy. Use a soft toothbrush to brush your teeth and be gentle when you floss. Contact a health care provider if: You have dizziness. You have mild pelvic cramps, pelvic pressure, or nagging pain in the abdominal area. You have persistent nausea, vomiting, or diarrhea. You have a bad smelling vaginal discharge. You have pain when you urinate. Get help right away if: You have a fever. You are leaking fluid from your vagina. You have spotting or bleeding from your vagina. You have severe abdominal cramping or pain. You have rapid weight gain or weight loss. You have shortness of breath with chest pain. You notice sudden or extreme swelling of your face, hands, ankles, feet, or legs. You  have not felt your baby move in over an hour. You have severe headaches that do not go away when you take medicine. You have vision changes. Summary The second trimester is from week 14 through week 27 (months 4 through 6). It is also a time when the fetus is growing rapidly. Your body goes through many changes during pregnancy. The changes vary from woman to woman. Avoid all smoking, herbs, alcohol, and unprescribed drugs. These chemicals affect the formation and growth your baby. Do not use any tobacco products, such as cigarettes, chewing tobacco, and e-cigarettes. If you need help quitting, ask your health care provider. Contact your health care provider if you have any questions. Keep all prenatal visits as told by your health care provider. This is important. This information is not intended to replace advice given to you by your health care provider. Make sure you discuss any questions you have with your health care provider. Document Released: 10/15/2001 Document Revised: 03/28/2016 Document Reviewed: 12/22/2012 Elsevier Interactive Patient Education  2017 [employer].  

## 2021-07-20 NOTE — Progress Notes (Signed)
[place] 19+5 WKS,cephalic,posterior fundal placenta gr 0,normal ovaries,fhr 171 BPM,SVP of fluid 4.9 cm,cx 5.2 cm,efw 332 G 67%,anatomy complete,no obvious abnormalities

## 2021-07-20 NOTE — Progress Notes (Signed)
LOW-RISK PREGNANCY VISIT Patient name: Rhonda Ali MRN [id number]  Date of birth: June 04, 1986 Chief Complaint:   Routine Prenatal Visit (U/S today)  History of Present Illness:   Rhonda Ali is a 35 y.o. G54P1011 female at [redacted]w[redacted]d with an Estimated Date of Delivery: 12/09/21 being seen today for ongoing management of a low-risk pregnancy.   Today she reports no complaints. Contractions: Not present.  .  Movement: Present. denies leaking of fluid.  Depression screen [facility] 2/9 05/28/2021 09/11/2020 05/27/2017  Decreased Interest 0 0 0  Down, Depressed, Hopeless 0 0 0  PHQ - 2 Score 0 0 0  Altered sleeping 1 0 -  Tired, decreased energy 1 1 -  Change in appetite 0 0 -  Feeling bad or failure about yourself  0 0 -  Trouble concentrating 0 0 -  Moving slowly or fidgety/restless 0 0 -  Suicidal thoughts 0 0 -  PHQ-9 Score 2 1 -     GAD 7 : Generalized Anxiety Score 05/28/2021 09/11/2020  Nervous, Anxious, on Edge 1 1  Control/stop worrying 0 0  Worry too much - different things 1 0  Trouble relaxing 0 0  Restless 0 0  Easily annoyed or irritable 0 0  Afraid - awful might happen 0 0  Total GAD 7 Score 2 1      Review of Systems:   Pertinent items are noted in HPI Denies abnormal vaginal discharge w/ itching/odor/irritation, headaches, visual changes, shortness of breath, chest pain, abdominal pain, severe nausea/vomiting, or problems with urination or bowel movements unless otherwise stated above. Pertinent History Reviewed:  Reviewed past medical,surgical, social, obstetrical and family history.  Reviewed problem list, medications and allergies. Physical Assessment:   Vitals:   07/20/21 1048  BP: 125/71  Pulse: 77  Weight: 175 lb 6.4 oz (79.6 kg)  Body mass index is 29.19 kg/m.        Physical Examination:   General appearance: Well appearing, and in no distress  Mental status: Alert, oriented to person, place, and time  Skin: Warm & dry  Cardiovascular: Normal heart  rate noted  Respiratory: Normal respiratory effort, no distress  Abdomen: Soft, gravid, nontender  Pelvic: Cervical exam deferred         Extremities: Edema: None  Fetal Status: Fetal Heart Rate (bpm): 171 u/s   Movement: Present  [place] 19+5 WKS,cephalic,posterior fundal placenta gr 0,normal ovaries,fhr 171 BPM,SVP of fluid 4.9 cm,cx 5.2 cm,efw 332 G 67%,anatomy complete,no obvious abnormalities     Chaperone: N/A   No results found for this or any previous visit (from the past 24 hour(s)).  Assessment & Plan:  1) Low-risk pregnancy G3P1011 at [redacted]w[redacted]d with an Estimated Date of Delivery: 12/09/21   2) Previous ASB> urine cx poc   Meds: No orders of the defined types were placed in this encounter.  Labs/procedures today: U/S, urine culture, and declined flu shot  Plan:  Continue routine obstetrical care  Next visit: prefers in person    Reviewed: Preterm labor symptoms and general obstetric precautions including but not limited to vaginal bleeding, contractions, leaking of fluid and fetal movement were reviewed in detail with the patient.  All questions were answered. Does have home bp cuff. Office bp cuff given: not applicable. Check bp weekly, let us know if consistently >140 and/or >90.  Follow-up: Return in about 4 weeks (around 08/17/2021) for LROB, CNM, in person.  Future Appointments  Date Time Provider Department Center  08/21/2021  8:30  AM Cheral Marker, CNM [facility]    Orders Placed This Encounter  Procedures   Urine Culture   Cheral Marker CNM, [facility] 07/20/2021 11:14 AM

## 2021-07-25 LAB — URINE CULTURE

## 2021-08-21 ENCOUNTER — Ambulatory Visit (INDEPENDENT_AMBULATORY_CARE_PROVIDER_SITE_OTHER): Payer: 59 | Admitting: Women's Health

## 2021-08-21 ENCOUNTER — Other Ambulatory Visit: Payer: Self-pay

## 2021-08-21 VITALS — BP 138/70 | HR 84 | Wt 189.0 lb

## 2021-08-21 DIAGNOSIS — Z3482 Encounter for supervision of other normal pregnancy, second trimester: Secondary | ICD-10-CM

## 2021-08-21 NOTE — Progress Notes (Signed)
LOW-RISK PREGNANCY VISIT Patient name: Rhonda Ali MRN [id number]  Date of birth: 01-16-1986 Chief Complaint:   Routine Prenatal Visit  History of Present Illness:   Rhonda Ali is a 35 y.o. G41P1011 female at [redacted]w[redacted]d with an Estimated Date of Delivery: 12/09/21 being seen today for ongoing management of a low-risk pregnancy.   Today she reports no complaints. Contractions: Not present. Vag. Bleeding: None.  Movement: Present. denies leaking of fluid.  Depression screen [facility] 2/9 05/28/2021 09/11/2020 05/27/2017  Decreased Interest 0 0 0  Down, Depressed, Hopeless 0 0 0  PHQ - 2 Score 0 0 0  Altered sleeping 1 0 -  Tired, decreased energy 1 1 -  Change in appetite 0 0 -  Feeling bad or failure about yourself  0 0 -  Trouble concentrating 0 0 -  Moving slowly or fidgety/restless 0 0 -  Suicidal thoughts 0 0 -  PHQ-9 Score 2 1 -     GAD 7 : Generalized Anxiety Score 05/28/2021 09/11/2020  Nervous, Anxious, on Edge 1 1  Control/stop worrying 0 0  Worry too much - different things 1 0  Trouble relaxing 0 0  Restless 0 0  Easily annoyed or irritable 0 0  Afraid - awful might happen 0 0  Total GAD 7 Score 2 1      Review of Systems:   Pertinent items are noted in HPI Denies abnormal vaginal discharge w/ itching/odor/irritation, headaches, visual changes, shortness of breath, chest pain, abdominal pain, severe nausea/vomiting, or problems with urination or bowel movements unless otherwise stated above. Pertinent History Reviewed:  Reviewed past medical,surgical, social, obstetrical and family history.  Reviewed problem list, medications and allergies. Physical Assessment:   Vitals:   08/21/21 0828  BP: 138/70  Pulse: 84  Weight: 189 lb (85.7 kg)  Body mass index is 31.45 kg/m.        Physical Examination:   General appearance: Well appearing, and in no distress  Mental status: Alert, oriented to person, place, and time  Skin: Warm & dry  Cardiovascular: Normal heart  rate noted  Respiratory: Normal respiratory effort, no distress  Abdomen: Soft, gravid, nontender  Pelvic: Cervical exam deferred         Extremities: Edema: Trace  Fetal Status: Fetal Heart Rate (bpm): 152 Fundal Height: 25 cm Movement: Present    Chaperone: N/A   No results found for this or any previous visit (from the past 24 hour(s)).  Assessment & Plan:  1) Low-risk pregnancy G3P1011 at [redacted]w[redacted]d with an Estimated Date of Delivery: 12/09/21    Meds: No orders of the defined types were placed in this encounter.  Labs/procedures today: none  Plan:  Continue routine obstetrical care  Next visit: prefers will be in person for pn2     Reviewed: Preterm labor symptoms and general obstetric precautions including but not limited to vaginal bleeding, contractions, leaking of fluid and fetal movement were reviewed in detail with the patient.  All questions were answered. Does have home bp cuff. Office bp cuff given: not applicable. Check bp weekly, let us know if consistently >140 and/or >90.  Follow-up: Return in about 4 weeks (around 09/18/2021) for LROB, PN2, CNM, in person.  Future Appointments  Date Time Provider Department Center  09/18/2021  8:30 AM [facility]  09/18/2021  8:50 AM Cheral Marker, CNM [facility]    No orders of the defined types were placed in this encounter.  Merlene Laughter  Ravneet Spilker CNM, WHNP-BC 08/21/2021 9:01 AM

## 2021-08-21 NOTE — Patient Instructions (Signed)
Rhonda Ali, thank you for choosing our office today! We appreciate the opportunity to meet your healthcare needs. You may receive a short survey by mail, e-mail, or through [employer]. If you are happy with your care we would appreciate if you could take just a few minutes to complete the survey questions. We read all of your comments and take your feedback very seriously. Thank you again for choosing our office.  Center for [employer] Team at [facility]  [facility] at [facility] ([address] [place], [place] [address]) Entrance C, located off of [address] Free 24/7 valet parking   You will have your sugar test next visit.  Please do not eat or drink anything after midnight the night before you come, not even water.  You will be here for at least two hours.  Please make an appointment online for the bloodwork at [email or web address] for 8:00am (or as close to this as possible). Make sure you select the [facility] service center.   CLASSES: Go to [email or web address] to register for classes (childbirth, breastfeeding, waterbirth, infant CPR, daddy bootcamp, etc.)  Call the office [phone]) or go to [facility] if: You begin to have strong, frequent contractions Your water breaks.  Sometimes it is a big gush of fluid, sometimes it is just a trickle that keeps getting your panties wet or running down your legs You have vaginal bleeding.  It is normal to have a small amount of spotting if your cervix was checked.  You don't feel your baby moving like normal.  If you don't, get you something to eat and drink and lay down and focus on feeling your baby move.   If your baby is still not moving like normal, you should call the office or go to [facility].  Call the office [phone]) or go to [facility] hospital for these signs of pre-eclampsia: Severe headache that does not go away with Tylenol Visual changes- seeing spots, double, blurred vision Pain under your right breast or upper  abdomen that does not go away with Tums or heartburn medicine Nausea and/or vomiting Severe swelling in your hands, feet, and face    [facility] Pediatricians/Family Doctors [facility]): [address] Colette Ribas, [phone]           [facility]: [address], [phone]                [facility]): [address], [phone] (call to ask if accepting patients) [facility] Department: [address] [place], [phone]    [facility] Pediatricians/Family Doctors [facility]): [address], [phone] [facility]: [address] [place], [phone] [facility]: [address], [phone]  [facility] Doctors  [facility]): [phone] [facility]: [address] [phone]   [facility] Doctors [facility]: [address] [phone]  [facility] Doctors  [facility]: [phone], [phone]  Home Blood Pressure Monitoring for Patients   Your provider has recommended that you check your blood pressure (BP) at least once a week at home. If you do not have a blood pressure cuff at home, one will be provided for you. Contact your provider if you have not received your monitor within 1 week.   Helpful Tips for Accurate Home Blood Pressure Checks  Don't smoke, exercise, or drink  caffeine 30 minutes before checking your BP Use the restroom before checking your BP (a full bladder can raise your pressure) Relax in a comfortable upright chair Feet on the ground Left arm resting comfortably on a flat surface at the level of your heart Legs uncrossed Back supported Sit quietly and don't talk Place the cuff on your bare arm Adjust snuggly, so that only two fingertips can fit between your skin and the top of the cuff Check 2  readings separated by at least one minute Keep a log of your BP readings For a visual, please reference this diagram: http://ccnc.care/bpdiagram  Provider Name: [facility]     Phone: [phone]  Zone 1: ALL CLEAR  Continue to monitor your symptoms:  BP reading is less than 140 (top number) or less than 90 (bottom number)  No right upper stomach pain No headaches or seeing spots No feeling nauseated or throwing up No swelling in face and hands  Zone 2: CAUTION Call your doctor's office for any of the following:  BP reading is greater than 140 (top number) or greater than 90 (bottom number)  Stomach pain under your ribs in the middle or right side Headaches or seeing spots Feeling nauseated or throwing up Swelling in face and hands  Zone 3: EMERGENCY  Seek immediate medical care if you have any of the following:  BP reading is greater than160 (top number) or greater than 110 (bottom number) Severe headaches not improving with Tylenol Serious difficulty catching your breath Any worsening symptoms from Zone 2   Second Trimester of Pregnancy The second trimester is from week 13 through week 28, months 4 through 6. The second trimester is often a time when you feel your best. Your body has also adjusted to being pregnant, and you begin to feel better physically. Usually, morning sickness has lessened or quit completely, you may have more energy, and you may have an increase in appetite. The second trimester is also a time when the fetus is growing rapidly. At the end of the sixth month, the fetus is about 9 inches long and weighs about 1 pounds. You will likely begin to feel the baby move (quickening) between 18 and 20 weeks of the pregnancy. BODY CHANGES Your body goes through many changes during pregnancy. The changes vary from woman to woman.  Your weight will continue to increase. You will notice your lower abdomen bulging out. You may begin to get stretch marks on your  hips, abdomen, and breasts. You may develop headaches that can be relieved by medicines approved by your health care provider. You may urinate more often because the fetus is pressing on your bladder. You may develop or continue to have heartburn as a result of your pregnancy. You may develop constipation because certain hormones are causing the muscles that push waste through your intestines to slow down. You may develop hemorrhoids or swollen, bulging veins (varicose veins). You may have back pain because of the weight gain and pregnancy hormones relaxing your joints between the bones in your pelvis and as a result of a shift in weight and the muscles that support your balance. Your breasts will continue to grow and be tender. Your gums may bleed and may be sensitive to brushing and flossing. Dark spots or blotches (chloasma, mask of pregnancy) may develop on your face. This will likely fade after the baby is born. A dark line from your belly button to the pubic area (linea nigra) may appear. This  will likely fade after the baby is born. You may have changes in your hair. These can include thickening of your hair, rapid growth, and changes in texture. Some women also have hair loss during or after pregnancy, or hair that feels dry or thin. Your hair will most likely return to normal after your baby is born. WHAT TO EXPECT AT YOUR PRENATAL VISITS During a routine prenatal visit: You will be weighed to make sure you and the fetus are growing normally. Your blood pressure will be taken. Your abdomen will be measured to track your baby's growth. The fetal heartbeat will be listened to. Any test results from the previous visit will be discussed. Your health care provider may ask you: How you are feeling. If you are feeling the baby move. If you have had any abnormal symptoms, such as leaking fluid, bleeding, severe headaches, or abdominal cramping. If you have any questions. Other tests that may  be performed during your second trimester include: Blood tests that check for: Low iron levels (anemia). Gestational diabetes (between 24 and 28 weeks). Rh antibodies. Urine tests to check for infections, diabetes, or protein in the urine. An ultrasound to confirm the proper growth and development of the baby. An amniocentesis to check for possible genetic problems. Fetal screens for spina bifida and Down syndrome. HOME CARE INSTRUCTIONS  Avoid all smoking, herbs, alcohol, and unprescribed drugs. These chemicals affect the formation and growth of the baby. Follow your health care provider's instructions regarding medicine use. There are medicines that are either safe or unsafe to take during pregnancy. Exercise only as directed by your health care provider. Experiencing uterine cramps is a good sign to stop exercising. Continue to eat regular, healthy meals. Wear a good support bra for breast tenderness. Do not use hot tubs, steam rooms, or saunas. Wear your seat belt at all times when driving. Avoid raw meat, uncooked cheese, cat litter boxes, and soil used by cats. These carry germs that can cause birth defects in the baby. Take your prenatal vitamins. Try taking a stool softener (if your health care provider approves) if you develop constipation. Eat more high-fiber foods, such as fresh vegetables or fruit and whole grains. Drink plenty of fluids to keep your urine clear or pale yellow. Take warm sitz baths to soothe any pain or discomfort caused by hemorrhoids. Use hemorrhoid cream if your health care provider approves. If you develop varicose veins, wear support hose. Elevate your feet for 15 minutes, 3-4 times a day. Limit salt in your diet. Avoid heavy lifting, wear low heel shoes, and practice good posture. Rest with your legs elevated if you have leg cramps or low back pain. Visit your dentist if you have not gone yet during your pregnancy. Use a soft toothbrush to brush your teeth  and be gentle when you floss. A sexual relationship may be continued unless your health care provider directs you otherwise. Continue to go to all your prenatal visits as directed by your health care provider. SEEK MEDICAL CARE IF:  You have dizziness. You have mild pelvic cramps, pelvic pressure, or nagging pain in the abdominal area. You have persistent nausea, vomiting, or diarrhea. You have a bad smelling vaginal discharge. You have pain with urination. SEEK IMMEDIATE MEDICAL CARE IF:  You have a fever. You are leaking fluid from your vagina. You have spotting or bleeding from your vagina. You have severe abdominal cramping or pain. You have rapid weight gain or loss. You have shortness of   breath with chest pain. You notice sudden or extreme swelling of your face, hands, ankles, feet, or legs. You have not felt your baby move in over an hour. You have severe headaches that do not go away with medicine. You have vision changes. Document Released: 10/15/2001 Document Revised: 10/26/2013 Document Reviewed: 12/22/2012 [facility] Patient Information 2015 [place], [place]. This information is not intended to replace advice given to you by your health care provider. Make sure you discuss any questions you have with your health care provider.

## 2021-09-18 ENCOUNTER — Other Ambulatory Visit: Payer: 59

## 2021-09-18 ENCOUNTER — Encounter: Payer: 59 | Admitting: Women's Health

## 2021-09-18 ENCOUNTER — Other Ambulatory Visit: Payer: Self-pay

## 2021-09-18 DIAGNOSIS — Z3A28 28 weeks gestation of pregnancy: Secondary | ICD-10-CM

## 2021-09-19 LAB — ANTIBODY SCREEN: Antibody Screen: NEGATIVE

## 2021-09-19 LAB — CBC
Hematocrit: 31.1 % — ABNORMAL LOW (ref 34.0–46.6)
Hemoglobin: 10.6 g/dL — ABNORMAL LOW (ref 11.1–15.9)
MCH: 28 pg (ref 26.6–33.0)
MCHC: 34.1 g/dL (ref 31.5–35.7)
MCV: 82 fL (ref 79–97)
Platelets: 235 10*3/uL (ref 150–450)
RBC: 3.78 x10E6/uL (ref 3.77–5.28)
RDW: 12.6 % (ref 11.7–15.4)
WBC: 11.6 10*3/uL — ABNORMAL HIGH (ref 3.4–10.8)

## 2021-09-19 LAB — GLUCOSE TOLERANCE, 2 HOURS W/ 1HR
Glucose, 1 hour: 169 mg/dL (ref 70–179)
Glucose, 2 hour: 137 mg/dL (ref 70–152)
Glucose, Fasting: 83 mg/dL (ref 70–91)

## 2021-09-19 LAB — HIV ANTIBODY (ROUTINE TESTING W REFLEX): HIV Screen 4th Generation wRfx: NONREACTIVE

## 2021-09-19 LAB — RPR: RPR Ser Ql: NONREACTIVE

## 2021-09-24 ENCOUNTER — Other Ambulatory Visit: Payer: Self-pay | Admitting: Women's Health

## 2021-09-24 ENCOUNTER — Encounter: Payer: Self-pay | Admitting: Women's Health

## 2021-09-24 MED ORDER — FERROUS SULFATE 325 (65 FE) MG PO TABS: 325.0000 mg | ORAL_TABLET | ORAL | 2 refills | Status: DC

## 2021-10-03 ENCOUNTER — Ambulatory Visit (INDEPENDENT_AMBULATORY_CARE_PROVIDER_SITE_OTHER): Payer: 59 | Admitting: Obstetrics & Gynecology

## 2021-10-03 ENCOUNTER — Encounter: Payer: Self-pay | Admitting: Obstetrics & Gynecology

## 2021-10-03 ENCOUNTER — Other Ambulatory Visit: Payer: Self-pay

## 2021-10-03 VITALS — BP 133/72 | HR 86 | Wt 205.8 lb

## 2021-10-03 DIAGNOSIS — Z3403 Encounter for supervision of normal first pregnancy, third trimester: Secondary | ICD-10-CM

## 2021-10-03 DIAGNOSIS — Z23 Encounter for immunization: Secondary | ICD-10-CM

## 2021-10-03 DIAGNOSIS — O360930 Maternal care for other rhesus isoimmunization, third trimester, not applicable or unspecified: Secondary | ICD-10-CM

## 2021-10-03 DIAGNOSIS — Z3A30 30 weeks gestation of pregnancy: Secondary | ICD-10-CM

## 2021-10-03 NOTE — Progress Notes (Signed)
   LOW-RISK PREGNANCY VISIT Patient name: Rhonda Ali MRN [id number]  Date of birth: [birth year] Chief Complaint:   Routine Prenatal Visit  History of Present Illness:   Rhonda Ali is a 35 y.o. G33P1011 female at [redacted]w[redacted]d with an Estimated Date of Delivery: 12/09/21 being seen today for ongoing management of a low-risk pregnancy.   Depression screen [facility] 2/9 10/03/2021 05/28/2021 09/11/2020 05/27/2017  Decreased Interest 0 0 0 0  Down, Depressed, Hopeless 0 0 0 0  PHQ - 2 Score 0 0 0 0  Altered sleeping 0 1 0 -  Tired, decreased energy 1 1 1  -  Change in appetite 0 0 0 -  Feeling bad or failure about yourself  0 0 0 -  Trouble concentrating 0 0 0 -  Moving slowly or fidgety/restless 0 0 0 -  Suicidal thoughts 0 0 0 -  PHQ-9 Score 1 2 1  -    Today she reports carpal tunnel symptoms. Contractions: Not present. Vag. Bleeding: None.  Movement: Present. denies leaking of fluid. Review of Systems:   Pertinent items are noted in HPI Denies abnormal vaginal discharge w/ itching/odor/irritation, headaches, visual changes, shortness of breath, chest pain, abdominal pain, severe nausea/vomiting, or problems with urination or bowel movements unless otherwise stated above. Pertinent History Reviewed:  Reviewed past medical,surgical, social, obstetrical and family history.  Reviewed problem list, medications and allergies.  Physical Assessment:   Vitals:   10/03/21 0842  BP: 133/72  Pulse: 86  Weight: 205 lb 12.8 oz (93.4 kg)  Body mass index is 34.25 kg/m.        Physical Examination:   General appearance: Well appearing, and in no distress  Mental status: Alert, oriented to person, place, and time  Skin: Warm & dry  Respiratory: Normal respiratory effort, no distress  Abdomen: Soft, gravid, nontender  Pelvic: Cervical exam deferred         Extremities: Edema: Trace  Psych:  mood and affect appropriate  Fetal Status: Fetal Heart Rate (bpm): 150 Fundal Height: 30 cm Movement: Present     Chaperone: n/a    No results found for this or any previous visit (from the past 24 hour(s)).   Assessment & Plan:  1) Low-risk pregnancy G3P1011 at [redacted]w[redacted]d with an Estimated Date of Delivery: 12/09/21   -RhoGAM, Tdap given today   Meds: No orders of the defined types were placed in this encounter.   Plan:  Continue routine obstetrical care  Next visit: prefers in person    Reviewed: Preterm labor symptoms and general obstetric precautions including but not limited to vaginal bleeding, contractions, leaking of fluid and fetal movement were reviewed in detail with the patient.  All questions were answered. Pt has home bp cuff. Check bp weekly, let [redacted]w[redacted]d know if >140/90.   Follow-up: Return in about 2 weeks (around 10/17/2021) for LROB visit.  Orders Placed This Encounter  Procedures   RHO (D) Immune Globulin   Tdap vaccine greater than or equal to 7yo IM    [place], DO Attending Obstetrician & Gynecologist, Faculty Practice Center for 10/19/2021, [facility]

## 2021-10-17 ENCOUNTER — Other Ambulatory Visit: Payer: Self-pay

## 2021-10-17 ENCOUNTER — Ambulatory Visit (INDEPENDENT_AMBULATORY_CARE_PROVIDER_SITE_OTHER): Payer: 59 | Admitting: Advanced Practice Midwife

## 2021-10-17 VITALS — BP 133/70 | HR 87 | Wt 209.0 lb

## 2021-10-17 DIAGNOSIS — Z3480 Encounter for supervision of other normal pregnancy, unspecified trimester: Secondary | ICD-10-CM

## 2021-10-17 DIAGNOSIS — O26899 Other specified pregnancy related conditions, unspecified trimester: Secondary | ICD-10-CM

## 2021-10-17 DIAGNOSIS — O99019 Anemia complicating pregnancy, unspecified trimester: Secondary | ICD-10-CM | POA: Insufficient documentation

## 2021-10-17 DIAGNOSIS — Z3A32 32 weeks gestation of pregnancy: Secondary | ICD-10-CM

## 2021-10-17 DIAGNOSIS — Z6791 Unspecified blood type, Rh negative: Secondary | ICD-10-CM

## 2021-10-17 NOTE — Progress Notes (Signed)
° °  LOW-RISK PREGNANCY VISIT Patient name: Rhonda Ali MRN [id number]  Date of birth: 01-27-1986 Chief Complaint:   Routine Prenatal Visit  History of Present Illness:   Rhonda Ali is a 35 y.o. G22P1011 female at [redacted]w[redacted]d with an Estimated Date of Delivery: 12/09/21 being seen today for ongoing management of a low-risk pregnancy.  Today she reports  doing well; belly band helps a little w comfort as she sits all day at work . Contractions: Not present. Vag. Bleeding: None.  Movement: Present. denies leaking of fluid. Review of Systems:   Pertinent items are noted in HPI Denies abnormal vaginal discharge w/ itching/odor/irritation, headaches, visual changes, shortness of breath, chest pain, abdominal pain, severe nausea/vomiting, or problems with urination or bowel movements unless otherwise stated above. Pertinent History Reviewed:  Reviewed past medical,surgical, social, obstetrical and family history.  Reviewed problem list, medications and allergies. Physical Assessment:   Vitals:   10/17/21 0920  BP: 133/70  Pulse: 87  Weight: 209 lb (94.8 kg)  Body mass index is 34.78 kg/m.        Physical Examination:   General appearance: Well appearing, and in no distress  Mental status: Alert, oriented to person, place, and time  Skin: Warm & dry  Cardiovascular: Normal heart rate noted  Respiratory: Normal respiratory effort, no distress  Abdomen: Soft, gravid, nontender  Pelvic: Cervical exam deferred         Extremities: Edema: Trace  Fetal Status: Fetal Heart Rate (bpm): 147 Fundal Height: 33 cm Movement: Present    No results found for this or any previous visit (from the past 24 hour(s)).  Assessment & Plan:  1) Low-risk pregnancy G3P1011 at [redacted]w[redacted]d with an Estimated Date of Delivery: 12/09/21   2) GBS+, ppx in labor   Meds: No orders of the defined types were placed in this encounter.  Labs/procedures today: none  Plan:  Continue routine obstetrical care   Reviewed:  Preterm labor symptoms and general obstetric precautions including but not limited to vaginal bleeding, contractions, leaking of fluid and fetal movement were reviewed in detail with the patient.  All questions were answered. Has home bp cuff. Check bp weekly, let us know if >140/90.   Follow-up: Return in about 2 weeks (around 10/31/2021) for LROB, in person.  No orders of the defined types were placed in this encounter.  Arabella Merles CNM 10/17/2021 9:40 AM

## 2021-10-17 NOTE — Patient Instructions (Signed)
Rhonda Ali, thank you for choosing our office today! We appreciate the opportunity to meet your healthcare needs. You may receive a short survey by mail, e-mail, or through [employer]. If you are happy with your care we would appreciate if you could take just a few minutes to complete the survey questions. We read all of your comments and take your feedback very seriously. Thank you again for choosing our office.  Center for [employer] Team at [facility]  [facility] at [facility] ([address] [place], [place] [address]) Entrance C, located off of [address] Free 24/7 valet parking   CLASSES: Go to [email or web address] to register for classes (childbirth, breastfeeding, waterbirth, infant CPR, daddy bootcamp, etc.)  Call the office [phone]) or go to [facility] if: You begin to have strong, frequent contractions Your water breaks.  Sometimes it is a big gush of fluid, sometimes it is just a trickle that keeps getting your panties wet or running down your legs You have vaginal bleeding.  It is normal to have a small amount of spotting if your cervix was checked.  You don't feel your baby moving like normal.  If you don't, get you something to eat and drink and lay down and focus on feeling your baby move.   If your baby is still not moving like normal, you should call the office or go to [facility].  Call the office [phone]) or go to [facility] hospital for these signs of pre-eclampsia: Severe headache that does not go away with Tylenol Visual changes- seeing spots, double, blurred vision Pain under your right breast or upper abdomen that does not go away with Tums or heartburn medicine Nausea and/or vomiting Severe swelling in your hands, feet, and face   Tdap Vaccine It is recommended that you get the Tdap vaccine during the third trimester of EACH pregnancy to help protect your baby from getting pertussis (whooping cough) 27-36 weeks is the BEST time to do  this so that you can pass the protection on to your baby. During pregnancy is better than after pregnancy, but if you are unable to get it during pregnancy it will be offered at the hospital.  You can get this vaccine with [place], at the health department, your family doctor, or some local pharmacies Everyone who will be around your baby should also be up-to-date on their vaccines before the baby comes. Adults (who are not pregnant) only need 1 dose of Tdap during adulthood.   [facility] Pediatricians/Family Doctors [facility]): [address] Colette Ribas, [phone]           [facility]: [address], [phone]                [facility]): [address], [phone] (call to ask if accepting patients) [facility] Department: [address], [place], [phone]    [facility] Pediatricians/Family Doctors [facility]): [phone], [phone] [facility]: [address] [place], [phone] [facility]: [address], [phone]  [facility] Doctors  [facility]): [phone] [facility]: [address], [phone]   [facility] Doctors [facility]: [address] [phone]  [facility] Family Doctors  [facility]: [phone], [phone]  Home Blood Pressure Monitoring for Patients   Your provider has recommended that you check your  blood pressure (BP) at least once a week at home. If you do not have a blood pressure cuff at home, one will be provided for you. Contact your provider if you have not received your monitor within 1 week.   Helpful Tips for Accurate Home Blood Pressure Checks  Don't smoke, exercise, or drink caffeine 30 minutes before checking your BP Use the restroom before checking your BP (a full bladder can raise your  pressure) Relax in a comfortable upright chair Feet on the ground Left arm resting comfortably on a flat surface at the level of your heart Legs uncrossed Back supported Sit quietly and don't talk Place the cuff on your bare arm Adjust snuggly, so that only two fingertips can fit between your skin and the top of the cuff Check 2 readings separated by at least one minute Keep a log of your BP readings For a visual, please reference this diagram: http://ccnc.care/bpdiagram  Provider Name: [facility]     Phone: [phone]  Zone 1: ALL CLEAR  Continue to monitor your symptoms:  BP reading is less than 140 (top number) or less than 90 (bottom number)  No right upper stomach pain No headaches or seeing spots No feeling nauseated or throwing up No swelling in face and hands  Zone 2: CAUTION Call your doctor's office for any of the following:  BP reading is greater than 140 (top number) or greater than 90 (bottom number)  Stomach pain under your ribs in the middle or right side Headaches or seeing spots Feeling nauseated or throwing up Swelling in face and hands  Zone 3: EMERGENCY  Seek immediate medical care if you have any of the following:  BP reading is greater than160 (top number) or greater than 110 (bottom number) Severe headaches not improving with Tylenol Serious difficulty catching your breath Any worsening symptoms from Zone 2   Third Trimester of Pregnancy The third trimester is from week 29 through week 42, months 7 through 9. The third trimester is a time when the fetus is growing rapidly. At the end of the ninth month, the fetus is about 20 inches in length and weighs 6-10 pounds.  BODY CHANGES Your body goes through many changes during pregnancy. The changes vary from woman to woman.  Your weight will continue to increase. You can expect to gain 25-35 pounds (11-16 kg) by the end of the pregnancy. You may begin to get stretch marks on your hips, abdomen,  and breasts. You may urinate more often because the fetus is moving lower into your pelvis and pressing on your bladder. You may develop or continue to have heartburn as a result of your pregnancy. You may develop constipation because certain hormones are causing the muscles that push waste through your intestines to slow down. You may develop hemorrhoids or swollen, bulging veins (varicose veins). You may have pelvic pain because of the weight gain and pregnancy hormones relaxing your joints between the bones in your pelvis. Backaches may result from overexertion of the muscles supporting your posture. You may have changes in your hair. These can include thickening of your hair, rapid growth, and changes in texture. Some women also have hair loss during or after pregnancy, or hair that feels dry or thin. Your hair will most likely return to normal after your baby is born. Your breasts will continue to grow and be tender. A yellow discharge may leak from your breasts called colostrum. Your belly button may stick out. You may   feel short of breath because of your expanding uterus. You may notice the fetus "dropping," or moving lower in your abdomen. You may have a bloody mucus discharge. This usually occurs a few days to a week before labor begins. Your cervix becomes thin and soft (effaced) near your due date. WHAT TO EXPECT AT YOUR PRENATAL EXAMS  You will have prenatal exams every 2 weeks until week 36. Then, you will have weekly prenatal exams. During a routine prenatal visit: You will be weighed to make sure you and the fetus are growing normally. Your blood pressure is taken. Your abdomen will be measured to track your baby's growth. The fetal heartbeat will be listened to. Any test results from the previous visit will be discussed. You may have a cervical check near your due date to see if you have effaced. At around 36 weeks, your caregiver will check your cervix. At the same time, your  caregiver will also perform a test on the secretions of the vaginal tissue. This test is to determine if a type of bacteria, Group B streptococcus, is present. Your caregiver will explain this further. Your caregiver may ask you: What your birth plan is. How you are feeling. If you are feeling the baby move. If you have had any abnormal symptoms, such as leaking fluid, bleeding, severe headaches, or abdominal cramping. If you have any questions. Other tests or screenings that may be performed during your third trimester include: Blood tests that check for low iron levels (anemia). Fetal testing to check the health, activity level, and growth of the fetus. Testing is done if you have certain medical conditions or if there are problems during the pregnancy. FALSE LABOR You may feel small, irregular contractions that eventually go away. These are called Braxton Hicks contractions, or false labor. Contractions may last for hours, days, or even weeks before true labor sets in. If contractions come at regular intervals, intensify, or become painful, it is best to be seen by your caregiver.  SIGNS OF LABOR  Menstrual-like cramps. Contractions that are 5 minutes apart or less. Contractions that start on the top of the uterus and spread down to the lower abdomen and back. A sense of increased pelvic pressure or back pain. A watery or bloody mucus discharge that comes from the vagina. If you have any of these signs before the 37th week of pregnancy, call your caregiver right away. You need to go to the hospital to get checked immediately. HOME CARE INSTRUCTIONS  Avoid all smoking, herbs, alcohol, and unprescribed drugs. These chemicals affect the formation and growth of the baby. Follow your caregiver's instructions regarding medicine use. There are medicines that are either safe or unsafe to take during pregnancy. Exercise only as directed by your caregiver. Experiencing uterine cramps is a good sign to  stop exercising. Continue to eat regular, healthy meals. Wear a good support bra for breast tenderness. Do not use hot tubs, steam rooms, or saunas. Wear your seat belt at all times when driving. Avoid raw meat, uncooked cheese, cat litter boxes, and soil used by cats. These carry germs that can cause birth defects in the baby. Take your prenatal vitamins. Try taking a stool softener (if your caregiver approves) if you develop constipation. Eat more high-fiber foods, such as fresh vegetables or fruit and whole grains. Drink plenty of fluids to keep your urine clear or pale yellow. Take warm sitz baths to soothe any pain or discomfort caused by hemorrhoids. Use hemorrhoid cream if  your caregiver approves. If you develop varicose veins, wear support hose. Elevate your feet for 15 minutes, 3-4 times a day. Limit salt in your diet. Avoid heavy lifting, wear low heal shoes, and practice good posture. Rest a lot with your legs elevated if you have leg cramps or low back pain. Visit your dentist if you have not gone during your pregnancy. Use a soft toothbrush to brush your teeth and be gentle when you floss. A sexual relationship may be continued unless your caregiver directs you otherwise. Do not travel far distances unless it is absolutely necessary and only with the approval of your caregiver. Take prenatal classes to understand, practice, and ask questions about the labor and delivery. Make a trial run to the hospital. Pack your hospital bag. Prepare the baby's nursery. Continue to go to all your prenatal visits as directed by your caregiver. SEEK MEDICAL CARE IF: You are unsure if you are in labor or if your water has broken. You have dizziness. You have mild pelvic cramps, pelvic pressure, or nagging pain in your abdominal area. You have persistent nausea, vomiting, or diarrhea. You have a bad smelling vaginal discharge. You have pain with urination. SEEK IMMEDIATE MEDICAL CARE IF:  You  have a fever. You are leaking fluid from your vagina. You have spotting or bleeding from your vagina. You have severe abdominal cramping or pain. You have rapid weight loss or gain. You have shortness of breath with chest pain. You notice sudden or extreme swelling of your face, hands, ankles, feet, or legs. You have not felt your baby move in over an hour. You have severe headaches that do not go away with medicine. You have vision changes. Document Released: 10/15/2001 Document Revised: 10/26/2013 Document Reviewed: 12/22/2012 [facility] Patient Information 2015 [place], [place]. This information is not intended to replace advice given to you by your health care provider. Make sure you discuss any questions you have with your health care provider.

## 2021-10-31 ENCOUNTER — Encounter: Payer: 59 | Admitting: Advanced Practice Midwife

## 2021-11-02 ENCOUNTER — Ambulatory Visit (INDEPENDENT_AMBULATORY_CARE_PROVIDER_SITE_OTHER): Payer: 59 | Admitting: Obstetrics & Gynecology

## 2021-11-02 ENCOUNTER — Other Ambulatory Visit: Payer: Self-pay

## 2021-11-02 ENCOUNTER — Encounter: Payer: Self-pay | Admitting: Obstetrics & Gynecology

## 2021-11-02 VITALS — BP 144/79 | HR 99 | Wt 213.6 lb

## 2021-11-02 DIAGNOSIS — R030 Elevated blood-pressure reading, without diagnosis of hypertension: Secondary | ICD-10-CM

## 2021-11-02 DIAGNOSIS — Z3483 Encounter for supervision of other normal pregnancy, third trimester: Secondary | ICD-10-CM

## 2021-11-02 NOTE — Progress Notes (Signed)
LOW-RISK PREGNANCY VISIT Patient name: Rhonda Ali MRN [id number]  Date of birth: [birth year] Chief Complaint:   Routine Prenatal Visit  History of Present Illness:   Rhonda Ali is a 35 y.o. G80P1011 female at [redacted]w[redacted]d with an Estimated Date of Delivery: 12/09/21 being seen today for ongoing management of a low-risk pregnancy.   Depression screen [facility] 2/9 10/03/2021 05/28/2021 09/11/2020 05/27/2017  Decreased Interest 0 0 0 0  Down, Depressed, Hopeless 0 0 0 0  PHQ - 2 Score 0 0 0 0  Altered sleeping 0 1 0 -  Tired, decreased energy 1 1 1  -  Change in appetite 0 0 0 -  Feeling bad or failure about yourself  0 0 0 -  Trouble concentrating 0 0 0 -  Moving slowly or fidgety/restless 0 0 0 -  Suicidal thoughts 0 0 0 -  PHQ-9 Score 1 2 1  -    Today she reports- issues with carpal tunnel.  Some swelling in her ankles at the end of the day. Contractions: Irritability. Vag. Bleeding: None.  Movement: Present. denies leaking of fluid. Review of Systems:   Pertinent items are noted in HPI Denies abnormal vaginal discharge w/ itching/odor/irritation, headaches, visual changes, shortness of breath, chest pain, abdominal pain, severe nausea/vomiting, or problems with urination or bowel movements unless otherwise stated above. Pertinent History Reviewed:  Reviewed past medical,surgical, social, obstetrical and family history.  Reviewed problem list, medications and allergies.  Physical Assessment:   Vitals:   11/02/21 0925 11/02/21 0928  BP: (!) 151/77 (!) 144/79  Pulse: 99 99  Weight: 213 lb 9.6 oz (96.9 kg)   Body mass index is 35.54 kg/m.        Physical Examination:   General appearance: Well appearing, and in no distress  Mental status: Alert, oriented to person, place, and time  Skin: Warm & dry  Respiratory: Normal respiratory effort, no distress  Abdomen: Soft, gravid, nontender  Pelvic: Cervical exam deferred         Extremities: Edema: Mild pitting, slight  indentation  Psych:  mood and affect appropriate  Fetal Status: Fetal Heart Rate (bpm): 145 Fundal Height: 34 cm Movement: Present    Chaperone: n/a    No results found for this or any previous visit (from the past 24 hour(s)).   Assessment & Plan:  1) Low-risk pregnancy G3P1011 at [redacted]w[redacted]d with an Estimated Date of Delivery: 12/09/21   2) Elevated BP- discussed concern for gestational HTN -baseline labs today -advised checking home BP more regularly -pt come in next week for visit/BP check -briefly discussed gHTN, preE and IOL 37-39wk pending BP -questions and concerns were addressed   Meds: No orders of the defined types were placed in this encounter.  Labs/procedures today: CBC, CMP, PC ratio  Plan:  Continue routine obstetrical care Next visit: prefers in person    Reviewed: Preterm labor symptoms and general obstetric precautions including but not limited to vaginal bleeding, contractions, leaking of fluid and fetal movement were reviewed in detail with the patient.  All questions were answered. Pt has home bp cuff. Check bp weekly, let [redacted]w[redacted]d know if >140/90.   Follow-up: Return in about 1 week (around 11/09/2021) for LROB visit.  Orders Placed This Encounter  Procedures   Comprehensive metabolic panel   CBC   Protein / creatinine ratio, urine   POC Urinalysis Dipstick OB    [place], DO Attending Obstetrician & Gynecologist, [facility] for [facility] [place], [facility]  [facility]

## 2021-11-03 LAB — COMPREHENSIVE METABOLIC PANEL
ALT: 7 IU/L (ref 0–32)
AST: 12 IU/L (ref 0–40)
Albumin/Globulin Ratio: 1.2 (ref 1.2–2.2)
Albumin: 3.7 g/dL — ABNORMAL LOW (ref 3.8–4.8)
Alkaline Phosphatase: 141 IU/L — ABNORMAL HIGH (ref 44–121)
BUN/Creatinine Ratio: 12 (ref 9–23)
BUN: 6 mg/dL (ref 6–20)
Bilirubin Total: <0.2 mg/dL (ref 0.0–1.2)
CO2: 21 mmol/L (ref 20–29)
Calcium: 9.5 mg/dL (ref 8.7–10.2)
Chloride: 99 mmol/L (ref 96–106)
Creatinine, Ser: 0.49 mg/dL — ABNORMAL LOW (ref 0.57–1.00)
Globulin, Total: 3 g/dL (ref 1.5–4.5)
Glucose: 109 mg/dL — ABNORMAL HIGH (ref 70–99)
Potassium: 4.1 mmol/L (ref 3.5–5.2)
Sodium: 135 mmol/L (ref 134–144)
Total Protein: 6.7 g/dL (ref 6.0–8.5)
eGFR: 128 mL/min/{1.73_m2} (ref >59)

## 2021-11-03 LAB — CBC
Hematocrit: 32.3 % — ABNORMAL LOW (ref 34.0–46.6)
Hemoglobin: 10.8 g/dL — ABNORMAL LOW (ref 11.1–15.9)
MCH: 27.3 pg (ref 26.6–33.0)
MCHC: 33.4 g/dL (ref 31.5–35.7)
MCV: 82 fL (ref 79–97)
Platelets: 248 10*3/uL (ref 150–450)
RBC: 3.95 x10E6/uL (ref 3.77–5.28)
RDW: 13.2 % (ref 11.7–15.4)
WBC: 12.1 10*3/uL — ABNORMAL HIGH (ref 3.4–10.8)

## 2021-11-03 LAB — PROTEIN / CREATININE RATIO, URINE
Creatinine, Urine: 64.3 mg/dL
Protein, Ur: 16.4 mg/dL
Protein/Creat Ratio: 255 mg/g creat — ABNORMAL HIGH (ref 0–200)

## 2021-11-05 ENCOUNTER — Encounter: Payer: Self-pay | Admitting: Advanced Practice Midwife

## 2021-11-09 ENCOUNTER — Ambulatory Visit (INDEPENDENT_AMBULATORY_CARE_PROVIDER_SITE_OTHER): Payer: Self-pay | Admitting: Obstetrics & Gynecology

## 2021-11-09 ENCOUNTER — Other Ambulatory Visit: Payer: Self-pay

## 2021-11-09 ENCOUNTER — Encounter: Payer: Self-pay | Admitting: Obstetrics & Gynecology

## 2021-11-09 VITALS — BP 120/72 | HR 91 | Wt 214.5 lb

## 2021-11-09 DIAGNOSIS — Z029 Encounter for administrative examinations, unspecified: Secondary | ICD-10-CM

## 2021-11-09 DIAGNOSIS — Z3480 Encounter for supervision of other normal pregnancy, unspecified trimester: Secondary | ICD-10-CM

## 2021-11-12 ENCOUNTER — Encounter: Payer: Self-pay | Admitting: *Deleted

## 2021-11-13 ENCOUNTER — Inpatient Hospital Stay (HOSPITAL_COMMUNITY)
Admission: AD | Admit: 2021-11-13 | Discharge: 2021-11-13 | Disposition: A | Discharge status: Home / Self Care | Payer: 59 | Attending: Obstetrics and Gynecology | Admitting: Obstetrics and Gynecology

## 2021-11-13 ENCOUNTER — Other Ambulatory Visit: Payer: Self-pay

## 2021-11-13 ENCOUNTER — Encounter (HOSPITAL_COMMUNITY): Payer: Self-pay | Admitting: Obstetrics and Gynecology

## 2021-11-13 DIAGNOSIS — Z3A36 36 weeks gestation of pregnancy: Secondary | ICD-10-CM | POA: Diagnosis not present

## 2021-11-13 DIAGNOSIS — Z0130 Encounter for examination of blood pressure without abnormal findings: Secondary | ICD-10-CM | POA: Insufficient documentation

## 2021-11-13 LAB — CBC
HCT: 31.8 % — ABNORMAL LOW (ref 36.0–46.0)
Hemoglobin: 10.1 g/dL — ABNORMAL LOW (ref 12.0–15.0)
MCH: 26.5 pg (ref 26.0–34.0)
MCHC: 31.8 g/dL (ref 30.0–36.0)
MCV: 83.5 fL (ref 80.0–100.0)
Platelets: 224 10*3/uL (ref 150–400)
RBC: 3.81 MIL/uL — ABNORMAL LOW (ref 3.87–5.11)
RDW: 13.9 % (ref 11.5–15.5)
WBC: 14.7 10*3/uL — ABNORMAL HIGH (ref 4.0–10.5)
nRBC: 0 % (ref 0.0–0.2)

## 2021-11-13 LAB — URINALYSIS, ROUTINE W REFLEX MICROSCOPIC
Bilirubin Urine: NEGATIVE
Glucose, UA: NEGATIVE mg/dL
Hgb urine dipstick: NEGATIVE
Ketones, ur: NEGATIVE mg/dL
Leukocytes,Ua: NEGATIVE
Nitrite: NEGATIVE
Protein, ur: NEGATIVE mg/dL
Specific Gravity, Urine: 1.025 (ref 1.005–1.030)
pH: 6.5 (ref 5.0–8.0)

## 2021-11-13 LAB — COMPREHENSIVE METABOLIC PANEL
ALT: 8 U/L (ref 0–44)
AST: 14 U/L — ABNORMAL LOW (ref 15–41)
Albumin: 2.7 g/dL — ABNORMAL LOW (ref 3.5–5.0)
Alkaline Phosphatase: 143 U/L — ABNORMAL HIGH (ref 38–126)
Anion gap: 10 (ref 5–15)
BUN: 6 mg/dL (ref 6–20)
CO2: 22 mmol/L (ref 22–32)
Calcium: 8.8 mg/dL — ABNORMAL LOW (ref 8.9–10.3)
Chloride: 102 mmol/L (ref 98–111)
Creatinine, Ser: 0.59 mg/dL (ref 0.44–1.00)
GFR, Estimated: >60 mL/min (ref >60)
Glucose, Bld: 87 mg/dL (ref 70–99)
Potassium: 3.9 mmol/L (ref 3.5–5.1)
Sodium: 134 mmol/L — ABNORMAL LOW (ref 135–145)
Total Bilirubin: 0.4 mg/dL (ref 0.3–1.2)
Total Protein: 6.8 g/dL (ref 6.5–8.1)

## 2021-11-13 LAB — PROTEIN / CREATININE RATIO, URINE
Creatinine, Urine: 110.4 mg/dL
Protein Creatinine Ratio: 0.2 mg/mg{Cre} — ABNORMAL HIGH (ref 0.00–0.15)
Total Protein, Urine: 22 mg/dL

## 2021-11-13 NOTE — MAU Note (Signed)
Presents with c/o elevated BP, took BO @ home, BP 141/74.  Reports has had elevated BP's @ office visits, monitoring BP's @ home per MD request.  Denies visual disturbances, H/A, and epigastric pain.  Denies VB or LOF.  Endorses +FM.

## 2021-11-13 NOTE — MAU Provider Note (Addendum)
History     CSN: [id number]  Arrival date and time: 11/13/21 1845   Event Date/Time   First Provider Initiated Contact with Patient 11/13/21 1918      Chief Complaint  Patient presents with   BP Evaluation   HPI Rhonda Ali is a 36 y.o. G3P1011 at [redacted]w[redacted]d who presents with elevated blood pressure. Denies history of hypertension outside of pregnancy. Had initial elevated BP a few weeks ago in the office. Reports BP at home today of 140s/90s. Denies headache, visual disturbance, or epigastric pain. Reports good fetal movement.   OB History     Gravida  3   Para  1   Term  1   Preterm      AB  1   Living  1      SAB  1   IAB      Ectopic      Multiple      Live Births  1           Past Medical History:  Diagnosis Date   Moodiness 04/27/2013   Nausea & vomiting 04/27/2013    Past Surgical History:  Procedure Laterality Date   MOUTH SURGERY      Family History  Problem Relation Age of Onset   High Cholesterol Mother    Healthy Father    Heart disease Maternal Grandmother    Hypertension Maternal Grandmother    Cancer Maternal Grandfather        melanoma   Stroke Maternal Grandfather     Social History   Tobacco Use   Smoking status: Former    Packs/day: 0.50    Years: 4.00    Pack years: 2.00    Types: Cigarettes   Smokeless tobacco: Never  Vaping Use   Vaping Use: Never used  Substance Use Topics   Alcohol use: No   Drug use: No    Types: Marijuana    Allergies: No Known Allergies  Medications Prior to Admission  Medication Sig Dispense Refill Last Dose   Coenzyme Q10 (CO Q 10) 100 MG CAPS Take by mouth.      ferrous sulfate 325 (65 FE) MG tablet Take 1 tablet (325 mg total) by mouth every other day. 45 tablet 2    Prenatal Vit-Fe Fumarate-FA (PRENATAL VITAMINS) 28-0.8 MG TABS Take by mouth.       Review of Systems  Constitutional: Negative.   Eyes:  Negative for visual disturbance.  Neurological:  Negative for  headaches.  Physical Exam   Blood pressure (!) 126/58, pulse 92, temperature 98.1 F (36.7 C), temperature source Oral, resp. rate 18, height 5\' 5"  (1.651 m), weight 97.8 kg, last menstrual period 03/04/2021, SpO2 99 %.  Patient Vitals for the past 24 hrs:  BP Temp Temp src Pulse Resp SpO2 Height Weight  11/13/21 2101 130/65 -- -- 93 -- -- -- --  11/13/21 2046 120/69 -- -- 99 -- -- -- --  11/13/21 2031 (!) 124/59 -- -- 95 -- -- -- --  11/13/21 2017 (!) 126/58 -- -- 92 -- 99 % -- --  11/13/21 2002 (!) 126/58 -- -- 87 -- 97 % -- --  11/13/21 1946 124/62 -- -- 87 -- -- -- --  11/13/21 1931 120/66 -- -- 97 -- -- -- --  11/13/21 1915 123/65 -- -- 97 -- 99 % -- --  11/13/21 1914 131/67 -- -- (!) 103 -- -- -- --  11/13/21 1858 127/69 98.1 F (36.7 C) Oral  95 18 99 % -- --  11/13/21 1853 -- -- -- -- -- -- 5\' 5"  (1.651 m) 97.8 kg   Physical Exam Vitals and nursing note reviewed.  Constitutional:      General: She is not in acute distress.    Appearance: Normal appearance.  HENT:     Head: Normocephalic.  Eyes:     General: No scleral icterus.    Conjunctiva/sclera: Conjunctivae normal.  Cardiovascular:     Rate and Rhythm: Normal rate and regular rhythm.     Heart sounds: Normal heart sounds.  Pulmonary:     Effort: Pulmonary effort is normal. No respiratory distress.     Breath sounds: Normal breath sounds.  Musculoskeletal:     Right lower leg: 1+ Pitting Edema present.     Left lower leg: 1+ Pitting Edema present.  Neurological:     Mental Status: She is alert.     Deep Tendon Reflexes:     Reflex Scores:      Patellar reflexes are 2+ on the right side and 2+ on the left side.    Comments: No clonus   NST:  Baseline: 140 bpm, Variability: Good {> 6 bpm), Accelerations: Reactive, and Decelerations: Absent  MAU Course  Procedures Results for orders placed or performed during the hospital encounter of 11/13/21 (from the past 24 hour(s))  Urinalysis, Routine w reflex  microscopic Urine, Clean Catch     Status: None   Collection Time: 11/13/21  7:47 PM  Result Value Ref Range   Color, Urine YELLOW YELLOW   APPearance CLEAR CLEAR   Specific Gravity, Urine 1.025 1.005 - 1.030   pH 6.5 5.0 - 8.0   Glucose, UA NEGATIVE NEGATIVE mg/dL   Hgb urine dipstick NEGATIVE NEGATIVE   Bilirubin Urine NEGATIVE NEGATIVE   Ketones, ur NEGATIVE NEGATIVE mg/dL   Protein, ur NEGATIVE NEGATIVE mg/dL   Nitrite NEGATIVE NEGATIVE   Leukocytes,Ua NEGATIVE NEGATIVE  Protein / creatinine ratio, urine     Status: Abnormal   Collection Time: 11/13/21  7:47 PM  Result Value Ref Range   Creatinine, Urine 110.40 mg/dL   Total Protein, Urine 22 mg/dL   Protein Creatinine Ratio 0.20 (H) 0.00 - 0.15 mg/mg[Cre]  CBC     Status: Abnormal   Collection Time: 11/13/21  8:11 PM  Result Value Ref Range   WBC 14.7 (H) 4.0 - 10.5 K/uL   RBC 3.81 (L) 3.87 - 5.11 MIL/uL   Hemoglobin 10.1 (L) 12.0 - 15.0 g/dL   HCT 31.8 (L) 36.0 - 46.0 %   MCV 83.5 80.0 - 100.0 fL   MCH 26.5 26.0 - 34.0 pg   MCHC 31.8 30.0 - 36.0 g/dL   RDW 13.9 11.5 - 15.5 %   Platelets 224 150 - 400 K/uL   nRBC 0.0 0.0 - 0.2 %  Comprehensive metabolic panel     Status: Abnormal   Collection Time: 11/13/21  8:11 PM  Result Value Ref Range   Sodium 134 (L) 135 - 145 mmol/L   Potassium 3.9 3.5 - 5.1 mmol/L   Chloride 102 98 - 111 mmol/L   CO2 22 22 - 32 mmol/L   Glucose, Bld 87 70 - 99 mg/dL   BUN 6 6 - 20 mg/dL   Creatinine, Ser 0.59 0.44 - 1.00 mg/dL   Calcium 8.8 (L) 8.9 - 10.3 mg/dL   Total Protein 6.8 6.5 - 8.1 g/dL   Albumin 2.7 (L) 3.5 - 5.0 g/dL   AST 14 (L)  15 - 41 U/L   ALT 8 0 - 44 U/L   Alkaline Phosphatase 143 (H) 38 - 126 U/L   Total Bilirubin 0.4 0.3 - 1.2 mg/dL   GFR, Estimated >60 >60 mL/min   Anion gap 10 5 - 15    MDM Normotensive in MAU. Preeclampsia labs in process  Care turned over to [place], NP  11/13/2021 8:47 PM   Reviewed normal labs and BPs with  patient. Long discussion of preeclampsia precautions and when to return to MAU. Pateint instructed to keep appointment in office this week and advised if BP is elevated again, she will be induced for gHTN. Patient verbalzied understanding.   Assessment and Plan   1. Blood pressure check   2. [redacted] weeks gestation of pregnancy    -Discharge home in stable condition -Preeclampsia precautions discussed -Patient advised to follow-up with OB as scheduled for prenatal care -Patient may return to MAU as needed or if her condition were to change or worsen  Wende Mott, CNM 11/13/21 9:21 PM

## 2021-11-13 NOTE — Discharge Instructions (Signed)

## 2021-11-16 ENCOUNTER — Other Ambulatory Visit (HOSPITAL_COMMUNITY)
Admission: RE | Admit: 2021-11-16 | Discharge: 2021-11-16 | Disposition: A | Discharge status: Home / Self Care | Payer: 59 | Source: Ambulatory Visit | Attending: Obstetrics & Gynecology | Admitting: Obstetrics & Gynecology

## 2021-11-16 ENCOUNTER — Ambulatory Visit (INDEPENDENT_AMBULATORY_CARE_PROVIDER_SITE_OTHER): Payer: 59 | Admitting: Obstetrics & Gynecology

## 2021-11-16 ENCOUNTER — Other Ambulatory Visit: Payer: Self-pay

## 2021-11-16 ENCOUNTER — Encounter: Payer: Self-pay | Admitting: Obstetrics & Gynecology

## 2021-11-16 VITALS — BP 139/80 | HR 100 | Wt 215.0 lb

## 2021-11-16 DIAGNOSIS — Z3A36 36 weeks gestation of pregnancy: Secondary | ICD-10-CM | POA: Insufficient documentation

## 2021-11-16 DIAGNOSIS — Z113 Encounter for screening for infections with a predominantly sexual mode of transmission: Secondary | ICD-10-CM | POA: Diagnosis not present

## 2021-11-16 DIAGNOSIS — Z3483 Encounter for supervision of other normal pregnancy, third trimester: Secondary | ICD-10-CM | POA: Insufficient documentation

## 2021-11-16 LAB — OB RESULTS CONSOLE GC/CHLAMYDIA: Gonorrhea: NEGATIVE

## 2021-11-16 NOTE — Progress Notes (Signed)
° °  LOW-RISK PREGNANCY VISIT Patient name: Rhonda Ali MRN [id number]  Date of birth: [birth year] Chief Complaint:   Routine Prenatal Visit  History of Present Illness:   Rhonda Ali is a 36 y.o. G86P1011 female at [redacted]w[redacted]d with an Estimated Date of Delivery: 12/09/21 being seen today for ongoing management of a low-risk pregnancy.  Depression screen [facility] 2/9 10/03/2021 05/28/2021 09/11/2020 05/27/2017  Decreased Interest 0 0 0 0  Down, Depressed, Hopeless 0 0 0 0  PHQ - 2 Score 0 0 0 0  Altered sleeping 0 1 0 -  Tired, decreased energy 1 1 1  -  Change in appetite 0 0 0 -  Feeling bad or failure about yourself  0 0 0 -  Trouble concentrating 0 0 0 -  Moving slowly or fidgety/restless 0 0 0 -  Suicidal thoughts 0 0 0 -  PHQ-9 Score 1 2 1  -    Today she reports no complaints. Contractions: Irritability. Vag. Bleeding: None.  Movement: Present. denies leaking of fluid. Review of Systems:   Pertinent items are noted in HPI Denies abnormal vaginal discharge w/ itching/odor/irritation, headaches, visual changes, shortness of breath, chest pain, abdominal pain, severe nausea/vomiting, or problems with urination or bowel movements unless otherwise stated above. Pertinent History Reviewed:  Reviewed past medical,surgical, social, obstetrical and family history.  Reviewed problem list, medications and allergies. Physical Assessment:   Vitals:   11/16/21 0831  BP: 139/80  Pulse: 100  Weight: 215 lb (97.5 kg)  Body mass index is 35.78 kg/m.        Physical Examination:   General appearance: Well appearing, and in no distress  Mental status: Alert, oriented to person, place, and time  Skin: Warm & dry  Cardiovascular: Normal heart rate noted  Respiratory: Normal respiratory effort, no distress  Abdomen: Soft, gravid, nontender  Pelvic: Cervical exam performed  Dilation: Closed Effacement (%): Thick Station: Ballotable  Extremities: Edema: Trace  Fetal Status: Fetal Heart Rate (bpm): 152  Fundal Height: 36 cm Movement: Present Presentation: Vertex  Chaperone: Amanda Rash    No results found for this or any previous visit (from the past 24 hour(s)).  Assessment & Plan:  1) Low-risk pregnancy G3P1011 at [redacted]w[redacted]d with an Estimated Date of Delivery: 12/09/21   2) Borderline BP, see back a little early workup 1/10 negative,    Meds: No orders of the defined types were placed in this encounter.  Labs/procedures today: cultures  Plan:  Continue routine obstetrical care  Next visit: prefers in person    Reviewed: Preterm labor symptoms and general obstetric precautions including but not limited to vaginal bleeding, contractions, leaking of fluid and fetal movement were reviewed in detail with the patient.  All questions were answered. Has home bp cuff.  Check bp weekly, let [redacted]w[redacted]d know if >140/90.   Follow-up: Return in about 5 days (around 11/21/2021) for LROB, alittle earlier than scheduled to watch BP.  Orders Placed This Encounter  Procedures   Culture, beta strep (group b only)    [place], MD 11/16/2021 8:49 AM

## 2021-11-19 LAB — CERVICOVAGINAL ANCILLARY ONLY
Chlamydia: NEGATIVE
Comment: NEGATIVE
Comment: NORMAL
Neisseria Gonorrhea: NEGATIVE

## 2021-11-20 LAB — CULTURE, BETA STREP (GROUP B ONLY): Strep Gp B Culture: NEGATIVE

## 2021-11-21 ENCOUNTER — Ambulatory Visit (INDEPENDENT_AMBULATORY_CARE_PROVIDER_SITE_OTHER): Payer: 59 | Admitting: Women's Health

## 2021-11-21 ENCOUNTER — Other Ambulatory Visit: Payer: Self-pay

## 2021-11-21 ENCOUNTER — Encounter: Payer: Self-pay | Admitting: Women's Health

## 2021-11-21 VITALS — BP 124/75 | HR 93 | Wt 217.5 lb

## 2021-11-21 DIAGNOSIS — Z3480 Encounter for supervision of other normal pregnancy, unspecified trimester: Secondary | ICD-10-CM

## 2021-11-21 DIAGNOSIS — Z3483 Encounter for supervision of other normal pregnancy, third trimester: Secondary | ICD-10-CM

## 2021-11-21 NOTE — Patient Instructions (Signed)
Rhonda Ali, thank you for choosing our office today! We appreciate the opportunity to meet your healthcare needs. You may receive a short survey by mail, e-mail, or through MyChart. If you are happy with your care we would appreciate if you could take just a few minutes to complete the survey questions. We read all of your comments and take your feedback very seriously. Thank you again for choosing our office.  [facility] ([address] [place], [place] [address]) Entrance C, located off of [address] Free 24/7 valet parking   CLASSES: Go to [email or web address] to register for classes (childbirth, breastfeeding, waterbirth, infant CPR, daddy bootcamp, etc.)  Call the office ([phone]) or go to [facility] if: You begin to have strong, frequent contractions Your water breaks.  Sometimes it is a big gush of fluid, sometimes it is just a trickle that keeps getting your panties wet or running down your legs You have vaginal bleeding.  It is normal to have a small amount of spotting if your cervix was checked.  You don't feel your baby moving like normal.  If you don't, get you something to eat and drink and lay down and focus on feeling your baby move.   If your baby is still not moving like normal, you should call the office or go to [facility].  Call the office ([phone]) or go to [facility] for these signs of pre-eclampsia: Severe headache that does not go away with Tylenol Visual changes- seeing spots, double, blurred vision Pain under your right breast or upper abdomen that does not go away with Tums or heartburn medicine Nausea and/or vomiting Severe swelling in your hands, feet, and face   [place] Pediatricians/Family Doctors [facility] ([facility]): [address], [phone]           [facility]: [address], [phone]                 [facility] ([facility]): [address], [phone] (call to ask if accepting patients) [facility]: [address], [place], [phone]    [place] Pediatricians/Family Doctors [facility] ([facility]): [address], [phone] [facility]: [address], [phone] [facility]: [address], [phone]  [place] [facility]  [facility] ([facility]): [phone] [facility]: [address], [phone]   [place] [facility]: [address], [phone]  [place] [facility]  [facility]: [address], [phone]  Home Blood Pressure Monitoring for Patients   Your provider has recommended that you check your blood pressure (BP) at least once a week at home. If you do not have a blood pressure cuff at home, one will be provided for you. Contact your provider if you have not received your monitor within 1 week.   Helpful Tips for Accurate Home Blood Pressure Checks  Don't smoke, exercise, or drink caffeine 30 minutes before checking your BP Use the restroom before checking your BP (a full bladder can raise your pressure) Relax in a comfortable upright chair Feet on the ground Left arm resting comfortably on a flat surface at the level of your heart Legs uncrossed Back supported Sit quietly and don't talk Place the cuff on your bare arm Adjust snuggly, so that only two fingertips   can fit between your skin and the top of the cuff Check 2 readings separated by at least one minute Keep a log of your BP readings For a visual, please reference this diagram: http://cc[facility].care/bpdiagram  Provider Name: [facility]     Phone: [phone]  Zone 1: ALL CLEAR  Continue to monitor your symptoms:  BP reading is less than 140 (top number) or less than 90 (bottom number)  No right  upper stomach pain No headaches or seeing spots No feeling nauseated or throwing up No swelling in face and hands  Zone 2: CAUTION Call your doctor's office for any of the following:  BP reading is greater than 140 (top number) or greater than 90 (bottom number)  Stomach pain under your ribs in the middle or right side Headaches or seeing spots Feeling nauseated or throwing up Swelling in face and hands  Zone 3: EMERGENCY  Seek immediate medical care if you have any of the following:  BP reading is greater than160 (top number) or greater than 110 (bottom number) Severe headaches not improving with Tylenol Serious difficulty catching your breath Any worsening symptoms from Zone 2   Braxton Hicks Contractions Contractions of the uterus can occur throughout pregnancy, but they are not always a sign that you are in labor. You may have practice contractions called Braxton Hicks contractions. These false labor contractions are sometimes confused with true labor. What are Rhonda Ali contractions? Braxton Hicks contractions are tightening movements that occur in the muscles of the uterus before labor. Unlike true labor contractions, these contractions do not result in opening (dilation) and thinning of the cervix. Toward the end of pregnancy (32-34 weeks), Braxton Hicks contractions can happen more often and may become stronger. These contractions are sometimes difficult to tell apart from true labor because they can be very uncomfortable. You should not feel embarrassed if you go to the hospital with false labor. Sometimes, the only way to tell if you are in true labor is for your health care provider to look for changes in the cervix. The health care provider will do a physical exam and may monitor your contractions. If you are not in true labor, the exam should show that your cervix is not dilating and your water has not broken. If there are no other health problems associated with your  pregnancy, it is completely safe for you to be sent home with false labor. You may continue to have Braxton Hicks contractions until you go into true labor. How to tell the difference between true labor and false labor True labor Contractions last 30-70 seconds. Contractions become very regular. Discomfort is usually felt in the top of the uterus, and it spreads to the lower abdomen and low back. Contractions do not go away with walking. Contractions usually become more intense and increase in frequency. The cervix dilates and gets thinner. False labor Contractions are usually shorter and not as strong as true labor contractions. Contractions are usually irregular. Contractions are often felt in the front of the lower abdomen and in the groin. Contractions may go away when you walk around or change positions while lying down. Contractions get weaker and are shorter-lasting as time goes on. The cervix usually does not dilate or become thin. Follow these instructions at home:  Take over-the-counter and prescription medicines only as told by your health care provider. Keep up with your usual exercises and follow other instructions from your health care provider. Eat and drink lightly if you think  you are going into labor. If Braxton Hicks contractions are making you uncomfortable: Change your position from lying down or resting to walking, or change from walking to resting. Sit and rest in a tub of warm water. Drink enough fluid to keep your urine pale yellow. Dehydration may cause these contractions. Do slow and deep breathing several times an hour. Keep all follow-up prenatal visits as told by your health care provider. This is important. Contact a health care provider if: You have a fever. You have continuous pain in your abdomen. Get help right away if: Your contractions become stronger, more regular, and closer together. You have fluid leaking or gushing from your vagina. You pass  blood-tinged mucus (bloody show). You have bleeding from your vagina. You have low back pain that you never had before. You feel your babys head pushing down and causing pelvic pressure. Your baby is not moving inside you as much as it used to. Summary Contractions that occur before labor are called Braxton Hicks contractions, false labor, or practice contractions. Braxton Hicks contractions are usually shorter, weaker, farther apart, and less regular than true labor contractions. True labor contractions usually become progressively stronger and regular, and they become more frequent. Manage discomfort from [facility] contractions by changing position, resting in a warm bath, drinking plenty of water, or practicing deep breathing. This information is not intended to replace advice given to you by your health care provider. Make sure you discuss any questions you have with your health care provider. Document Revised: 10/03/2017 Document Reviewed: 03/06/2017 Elsevier Patient Education  [place].

## 2021-11-21 NOTE — Progress Notes (Signed)
LOW-RISK PREGNANCY VISIT Patient name: Rhonda Ali MRN [id number]  Date of birth: 1986-01-22 Chief Complaint:   Routine Prenatal Visit (+ swelling)  History of Present Illness:   Rhonda Ali is a 36 y.o. G36P1011 female at [redacted]w[redacted]d with an Estimated Date of Delivery: 12/09/21 being seen today for ongoing management of a low-risk pregnancy.   Today she reports no complaints. Contractions: Irritability. Vag. Bleeding: None.  Movement: Present. denies leaking of fluid.  Depression screen [facility] 2/9 10/03/2021 05/28/2021 09/11/2020 05/27/2017  Decreased Interest 0 0 0 0  Down, Depressed, Hopeless 0 0 0 0  PHQ - 2 Score 0 0 0 0  Altered sleeping 0 1 0 -  Tired, decreased energy 1 1 1  -  Change in appetite 0 0 0 -  Feeling bad or failure about yourself  0 0 0 -  Trouble concentrating 0 0 0 -  Moving slowly or fidgety/restless 0 0 0 -  Suicidal thoughts 0 0 0 -  PHQ-9 Score 1 2 1  -     GAD 7 : Generalized Anxiety Score 10/03/2021 05/28/2021 09/11/2020  Nervous, Anxious, on Edge 0 1 1  Control/stop worrying 0 0 0  Worry too much - different things 0 1 0  Trouble relaxing 0 0 0  Restless 0 0 0  Easily annoyed or irritable 0 0 0  Afraid - awful might happen 0 0 0  Total GAD 7 Score 0 2 1      Review of Systems:   Pertinent items are noted in HPI Denies abnormal vaginal discharge w/ itching/odor/irritation, headaches, visual changes, shortness of breath, chest pain, abdominal pain, severe nausea/vomiting, or problems with urination or bowel movements unless otherwise stated above. Pertinent History Reviewed:  Reviewed past medical,surgical, social, obstetrical and family history.  Reviewed problem list, medications and allergies. Physical Assessment:   Vitals:   11/21/21 1553  BP: 124/75  Pulse: 93  Weight: 217 lb 8 oz (98.7 kg)  Body mass index is 36.19 kg/m.        Physical Examination:   General appearance: Well appearing, and in no distress  Mental status: Alert, oriented  to person, place, and time  Skin: Warm & dry  Cardiovascular: Normal heart rate noted  Respiratory: Normal respiratory effort, no distress  Abdomen: Soft, gravid, nontender  Pelvic: Cervical exam deferred         Extremities: Edema: Trace  Fetal Status: Fetal Heart Rate (bpm): 153 Fundal Height: 37 cm Movement: Present Presentation: Vertex  Chaperone: N/A   No results found for this or any previous visit (from the past 24 hour(s)).  Assessment & Plan:  1) Low-risk pregnancy G3P1011 at [redacted]w[redacted]d with an Estimated Date of Delivery: 12/09/21    Meds: No orders of the defined types were placed in this encounter.  Labs/procedures today: none  Plan:  Continue routine obstetrical care  Next visit: prefers in person    Reviewed: Term labor symptoms and general obstetric precautions including but not limited to vaginal bleeding, contractions, leaking of fluid and fetal movement were reviewed in detail with the patient.  All questions were answered. Does have home bp cuff. Office bp cuff given: not applicable. Check bp weekly, let us know if consistently >140 and/or >90.  Follow-up: Return for As scheduled.  Future Appointments  Date Time Provider [place]  11/30/2021  8:30 AM Janyth Pupa, DO [facility]  12/07/2021  8:30 AM Roma Schanz, CNM [facility]    No orders of  the defined types were placed in this encounter.  [place], [facility] 11/21/2021 4:26 PM

## 2021-11-22 ENCOUNTER — Telehealth: Payer: Self-pay | Admitting: *Deleted

## 2021-11-22 NOTE — Telephone Encounter (Signed)
Pt states she saw in her after visit summary to reach out if she noticed increased swelling. Pt is having increased swelling in feet and hands. BP 128/84. + baby movement. No spotting or leaking fluid. Pt was advised swelling is normal late in pregnancy. Keep feet elevated as much as possible and drink lots of water. Don't hesitate to reach out with further questions or concerns. Pt voiced understanding. JSY

## 2021-11-23 ENCOUNTER — Inpatient Hospital Stay (HOSPITAL_COMMUNITY)
Admission: AD | Admit: 2021-11-23 | Discharge: 2021-11-23 | Disposition: A | Discharge status: Home / Self Care | Payer: 59 | Attending: Obstetrics & Gynecology | Admitting: Obstetrics & Gynecology

## 2021-11-23 ENCOUNTER — Encounter: Payer: 59 | Admitting: Women's Health

## 2021-11-23 ENCOUNTER — Encounter (HOSPITAL_COMMUNITY): Payer: Self-pay | Admitting: Obstetrics & Gynecology

## 2021-11-23 DIAGNOSIS — R030 Elevated blood-pressure reading, without diagnosis of hypertension: Secondary | ICD-10-CM | POA: Diagnosis not present

## 2021-11-23 DIAGNOSIS — O26899 Other specified pregnancy related conditions, unspecified trimester: Secondary | ICD-10-CM

## 2021-11-23 DIAGNOSIS — O26893 Other specified pregnancy related conditions, third trimester: Secondary | ICD-10-CM | POA: Insufficient documentation

## 2021-11-23 DIAGNOSIS — Z6791 Unspecified blood type, Rh negative: Secondary | ICD-10-CM

## 2021-11-23 DIAGNOSIS — O99019 Anemia complicating pregnancy, unspecified trimester: Secondary | ICD-10-CM | POA: Diagnosis not present

## 2021-11-23 DIAGNOSIS — Z3A37 37 weeks gestation of pregnancy: Secondary | ICD-10-CM | POA: Diagnosis not present

## 2021-11-23 DIAGNOSIS — Z3480 Encounter for supervision of other normal pregnancy, unspecified trimester: Secondary | ICD-10-CM

## 2021-11-23 DIAGNOSIS — Z3689 Encounter for other specified antenatal screening: Secondary | ICD-10-CM

## 2021-11-23 LAB — URINALYSIS, ROUTINE W REFLEX MICROSCOPIC
Bilirubin Urine: NEGATIVE
Glucose, UA: 50 mg/dL — AB
Hgb urine dipstick: NEGATIVE
Ketones, ur: 5 mg/dL — AB
Leukocytes,Ua: NEGATIVE
Nitrite: NEGATIVE
Protein, ur: NEGATIVE mg/dL
Specific Gravity, Urine: 1.023 (ref 1.005–1.030)
pH: 5 (ref 5.0–8.0)

## 2021-11-23 LAB — PROTEIN / CREATININE RATIO, URINE
Creatinine, Urine: 158.78 mg/dL
Protein Creatinine Ratio: 0.14 mg/mg{Cre} (ref 0.00–0.15)
Total Protein, Urine: 22 mg/dL

## 2021-11-23 NOTE — MAU Provider Note (Signed)
History     CSN: [id number]  Arrival date and time: 11/23/21 1954   Event Date/Time   First Provider Initiated Contact with Patient 11/23/21 2055      Chief Complaint  Patient presents with   Hypertension   Rhonda Ali is a 36 y.o. G3P1011 at [redacted]w[redacted]d who receives care at [facility].  She presents today for Hypertension.  She states she took her blood pressure at home at 1400 and it was 143/81.  She states she repeated her blood pressure at 1630; 140/78 as well as at 1900; 149/79.  She also reports some swelling of her hands and feet and acknowledges that she sits throughout her workday.  She reports she her swelling is usually restricted to just the ankles.  Patient reports that she drinks approximately 4-5 bottles of water at work and another 1-3 bottles while at home.  She denies headache, RUQ pain, or blurry vision.  However she does report some intermittent spots in her left eye.  She endorses positive fetal movement and denies contractions or vaginal concerns including bleeding and discharge.   OB History     Gravida  3   Para  1   Term  1   Preterm      AB  1   Living  1      SAB  1   IAB      Ectopic      Multiple      Live Births  1           Past Medical History:  Diagnosis Date   Moodiness 04/27/2013   Nausea & vomiting 04/27/2013    Past Surgical History:  Procedure Laterality Date   MOUTH SURGERY      Family History  Problem Relation Age of Onset   High Cholesterol Mother    Healthy Father    Heart disease Maternal Grandmother    Hypertension Maternal Grandmother    Cancer Maternal Grandfather        melanoma   Stroke Maternal Grandfather     Social History   Tobacco Use   Smoking status: Former    Packs/day: 0.50    Years: 4.00    Pack years: 2.00    Types: Cigarettes   Smokeless tobacco: Never  Vaping Use   Vaping Use: Never used  Substance Use Topics   Alcohol use: No   Drug use: No    Types: Marijuana    Allergies:  No Known Allergies  Medications Prior to Admission  Medication Sig Dispense Refill Last Dose   Coenzyme Q10 (CO Q 10) 100 MG CAPS Take by mouth. (Patient not taking: Reported on 11/21/2021)      ferrous sulfate 325 (65 FE) MG tablet Take 1 tablet (325 mg total) by mouth every other day. 45 tablet 2    Prenatal Vit-Fe Fumarate-FA (PRENATAL VITAMINS) 28-0.8 MG TABS Take by mouth.       Review of Systems  Eyes:  Positive for visual disturbance (Intermittent Spots, Left Eye).  Gastrointestinal:  Negative for abdominal pain, nausea and vomiting.  Genitourinary:  Negative for vaginal bleeding and vaginal discharge.  Neurological:  Negative for dizziness, light-headedness and headaches.  Physical Exam   Blood pressure 133/77, pulse (!) 111, temperature 98.1 F (36.7 C), resp. rate 18, height 5\' 5"  (1.651 m), weight 98.9 kg, last menstrual period 03/04/2021. blood pressure Vitals:   11/23/21 2021 11/23/21 2044 11/23/21 2046 11/23/21 2100  BP: 133/77 (!) 136/59 121/68 119/64  11/23/21 2115 11/23/21 2131  BP: 129/64 126/68     Physical Exam Vitals reviewed.  Constitutional:      General: She is not in acute distress.    Appearance: Normal appearance. She is not toxic-appearing.  HENT:     Head: Normocephalic and atraumatic.  Eyes:     Conjunctiva/sclera: Conjunctivae normal.  Cardiovascular:     Rate and Rhythm: Normal rate and regular rhythm.     Heart sounds: Normal heart sounds.  Pulmonary:     Effort: Pulmonary effort is normal. No respiratory distress.     Breath sounds: Normal breath sounds.  Abdominal:     General: Bowel sounds are normal.     Comments: Gravid, Appears AGA  Musculoskeletal:     Cervical back: Normal range of motion.     Right lower leg: Edema present.     Left lower leg: Edema present.  Skin:    General: Skin is warm and dry.  Neurological:     Mental Status: She is alert and oriented to person, place, and time.  Psychiatric:        Mood and Affect:  Mood normal.        Behavior: Behavior normal.        Thought Content: Thought content normal.    Fetal Assessment 150 bpm, Mod Var, -Decels, +Accels Toco: Irritability  MAU Course   Results for orders placed or performed during the hospital encounter of 11/23/21 (from the past 24 hour(s))  Urinalysis, Routine w reflex microscopic Urine, Clean Catch     Status: Abnormal   Collection Time: 11/23/21  8:45 PM  Result Value Ref Range   Color, Urine YELLOW YELLOW   APPearance HAZY (A) CLEAR   Specific Gravity, Urine 1.023 1.005 - 1.030   pH 5.0 5.0 - 8.0   Glucose, UA 50 (A) NEGATIVE mg/dL   Hgb urine dipstick NEGATIVE NEGATIVE   Bilirubin Urine NEGATIVE NEGATIVE   Ketones, ur 5 (A) NEGATIVE mg/dL   Protein, ur NEGATIVE NEGATIVE mg/dL   Nitrite NEGATIVE NEGATIVE   Leukocytes,Ua NEGATIVE NEGATIVE  Protein / creatinine ratio, urine     Status: None   Collection Time: 11/23/21  8:45 PM  Result Value Ref Range   Creatinine, Urine 158.78 mg/dL   Total Protein, Urine 22 mg/dL   Protein Creatinine Ratio 0.14 0.00 - 0.15 mg/mg[Cre]   No results found.  MDM PE Labs: PC Ratio EFM  Assessment and Plan  36 year old G3P1011  SIUP at 37.6 weeks Cat I FT Elevated BP at home   -Exam performed -Reviewed bp and informed normal. -Discussed obtaining PC ratio initially and if elevated from previous will collect additional labs. -Discussed techniques to improve swelling including elevating feet.  Encouraged to continue to ambulate, intermittently, during work day and drink fluids. -NST Reactive. -Will await results.    Maryann Conners MSN, CNM 11/23/2021, 8:55 PM   Reassessment (10:10 PM)  -Results as above.  -BP remain normotensive -Dr. Toma Deiters consulted and informed of patient status, evaluation, interventions, and results. Advised: *No further interventions necessary. *Okay to discharge home. *Follow up as scheduled. -Provider to bedside to discuss findings and  POC. -Instructed to continue to take bp daily, but try to do so at same time after 10 minutes of rest. -Return precautions reviewed. -Discharged to home in stable condition.  Maryann Conners MSN, CNM Advanced Practice Provider, [facility]

## 2021-11-23 NOTE — MAU Note (Signed)
Has been having some b/p problems for a few weeks. Pt reports she took her b/p at home and it was 140's/70-80.  Denies any headache or visual changes. Reports some increased swelling in her feet and good fetal movement .

## 2021-11-30 ENCOUNTER — Other Ambulatory Visit: Payer: Self-pay | Admitting: Family Medicine

## 2021-11-30 ENCOUNTER — Encounter: Payer: Self-pay | Admitting: Family Medicine

## 2021-11-30 ENCOUNTER — Ambulatory Visit (INDEPENDENT_AMBULATORY_CARE_PROVIDER_SITE_OTHER): Payer: 59 | Admitting: Obstetrics & Gynecology

## 2021-11-30 ENCOUNTER — Encounter: Payer: Self-pay | Admitting: Obstetrics & Gynecology

## 2021-11-30 ENCOUNTER — Other Ambulatory Visit: Payer: Self-pay

## 2021-11-30 VITALS — BP 136/83 | HR 106 | Wt 218.0 lb

## 2021-11-30 DIAGNOSIS — Z3480 Encounter for supervision of other normal pregnancy, unspecified trimester: Secondary | ICD-10-CM

## 2021-11-30 DIAGNOSIS — O139 Gestational [pregnancy-induced] hypertension without significant proteinuria, unspecified trimester: Secondary | ICD-10-CM | POA: Insufficient documentation

## 2021-11-30 DIAGNOSIS — O133 Gestational [pregnancy-induced] hypertension without significant proteinuria, third trimester: Secondary | ICD-10-CM

## 2021-11-30 DIAGNOSIS — Z8759 Personal history of other complications of pregnancy, childbirth and the puerperium: Secondary | ICD-10-CM | POA: Insufficient documentation

## 2021-11-30 NOTE — Progress Notes (Signed)
° °  LOW-RISK PREGNANCY VISIT Patient name: Rhonda Ali MRN [id number]  Date of birth: May 02, 1986 Chief Complaint:   Routine Prenatal Visit  History of Present Illness:   NIAMIAH Ali is a 36 y.o. G83P1011 female at [redacted]w[redacted]d with an Estimated Date of Delivery: 12/09/21 being seen today for ongoing management of a low-risk pregnancy.    Based on review of her BPs and home BPs- pt meets criteria for gestational HTN.  Denies headache. +edema, worse at the tend of the day  Depression screen [facility] 2/9 10/03/2021 05/28/2021 09/11/2020 05/27/2017  Decreased Interest 0 0 0 0  Down, Depressed, Hopeless 0 0 0 0  PHQ - 2 Score 0 0 0 0  Altered sleeping 0 1 0 -  Tired, decreased energy 1 1 1  -  Change in appetite 0 0 0 -  Feeling bad or failure about yourself  0 0 0 -  Trouble concentrating 0 0 0 -  Moving slowly or fidgety/restless 0 0 0 -  Suicidal thoughts 0 0 0 -  PHQ-9 Score 1 2 1  -    Today she reports no complaints. Contractions: Irritability. Vag. Bleeding: None.  Movement: Present. denies leaking of fluid. Review of Systems:   Pertinent items are noted in HPI Denies abnormal vaginal discharge w/ itching/odor/irritation, headaches, visual changes, shortness of breath, chest pain, abdominal pain, severe nausea/vomiting, or problems with urination or bowel movements unless otherwise stated above. Pertinent History Reviewed:  Reviewed past medical,surgical, social, obstetrical and family history.  Reviewed problem list, medications and allergies.  Physical Assessment:   Vitals:   11/30/21 1147 11/30/21 1210  BP: 130/80 136/83  Pulse: (!) 101 (!) 106  Weight: 218 lb (98.9 kg)   Body mass index is 36.28 kg/m.        Physical Examination:   General appearance: Well appearing, and in no distress  Mental status: Alert, oriented to person, place, and time  Skin: Warm & dry  Respiratory: Normal respiratory effort, no distress  Abdomen: Soft, gravid, nontender  Pelvic: Cervical exam  performed  Dilation: Fingertip Effacement (%): Thick Station: -3  Extremities: Edema: Trace  Psych:  mood and affect appropriate  Fetal Status: Fetal Heart Rate (bpm): 140 Fundal Height: 38 cm Movement: Present Presentation: Vertex  Chaperone:  partner present     No results found for this or any previous visit (from the past 24 hour(s)).   Assessment & Plan:  1) High-risk pregnancy G3P1011 at [redacted]w[redacted]d with an Estimated Date of Delivery: 12/09/21   2) Gestational HTN- plan to induce at next available, IOL scheduled for Sunday at midnight -preeclampsia precautions reviewed  3) GBS positive- PCN in labor   Meds: No orders of the defined types were placed in this encounter.  Labs/procedures today: doppler  Plan:  Continue routine obstetrical care  Next visit: prefers in person    Reviewed: Term labor symptoms and general obstetric precautions including but not limited to vaginal bleeding, contractions, leaking of fluid and fetal movement were reviewed in detail with the patient.  All questions were answered. Pt has home bp cuff. Check bp weekly, let us know if >140/90.   Follow-up: Return in about 1 week (around 12/07/2021) for postpartum BP check (RN visit) then 5wk Postpartum visit.  No orders of the defined types were placed in this encounter.   Janyth Pupa, DO Attending [place], [facility] for [employer], [place]

## 2021-12-01 ENCOUNTER — Other Ambulatory Visit: Payer: Self-pay

## 2021-12-02 ENCOUNTER — Inpatient Hospital Stay (HOSPITAL_COMMUNITY): Payer: 59

## 2021-12-02 ENCOUNTER — Inpatient Hospital Stay (HOSPITAL_COMMUNITY)
Admission: AD | Admit: 2021-12-02 | Discharge: 2021-12-06 | DRG: 787 | Disposition: A | Discharge status: Home / Self Care | Payer: 59 | Attending: Obstetrics and Gynecology | Admitting: Obstetrics and Gynecology

## 2021-12-02 ENCOUNTER — Encounter (HOSPITAL_COMMUNITY): Payer: Self-pay | Admitting: Obstetrics & Gynecology

## 2021-12-02 DIAGNOSIS — O9902 Anemia complicating childbirth: Secondary | ICD-10-CM | POA: Diagnosis not present

## 2021-12-02 DIAGNOSIS — Z3A39 39 weeks gestation of pregnancy: Secondary | ICD-10-CM | POA: Diagnosis not present

## 2021-12-02 DIAGNOSIS — O26893 Other specified pregnancy related conditions, third trimester: Secondary | ICD-10-CM | POA: Diagnosis present

## 2021-12-02 DIAGNOSIS — O99824 Streptococcus B carrier state complicating childbirth: Secondary | ICD-10-CM | POA: Diagnosis present

## 2021-12-02 DIAGNOSIS — D62 Acute posthemorrhagic anemia: Secondary | ICD-10-CM | POA: Diagnosis not present

## 2021-12-02 DIAGNOSIS — O0993 Supervision of high risk pregnancy, unspecified, third trimester: Secondary | ICD-10-CM

## 2021-12-02 DIAGNOSIS — Z8759 Personal history of other complications of pregnancy, childbirth and the puerperium: Secondary | ICD-10-CM | POA: Diagnosis present

## 2021-12-02 DIAGNOSIS — O133 Gestational [pregnancy-induced] hypertension without significant proteinuria, third trimester: Secondary | ICD-10-CM

## 2021-12-02 DIAGNOSIS — Z20822 Contact with and (suspected) exposure to covid-19: Secondary | ICD-10-CM | POA: Diagnosis present

## 2021-12-02 DIAGNOSIS — R8271 Bacteriuria: Secondary | ICD-10-CM | POA: Diagnosis present

## 2021-12-02 DIAGNOSIS — O134 Gestational [pregnancy-induced] hypertension without significant proteinuria, complicating childbirth: Principal | ICD-10-CM | POA: Diagnosis present

## 2021-12-02 DIAGNOSIS — O139 Gestational [pregnancy-induced] hypertension without significant proteinuria, unspecified trimester: Secondary | ICD-10-CM | POA: Diagnosis present

## 2021-12-02 DIAGNOSIS — Z87891 Personal history of nicotine dependence: Secondary | ICD-10-CM | POA: Diagnosis not present

## 2021-12-02 DIAGNOSIS — Z6791 Unspecified blood type, Rh negative: Secondary | ICD-10-CM

## 2021-12-02 DIAGNOSIS — O9081 Anemia of the puerperium: Secondary | ICD-10-CM | POA: Diagnosis not present

## 2021-12-02 DIAGNOSIS — O99019 Anemia complicating pregnancy, unspecified trimester: Secondary | ICD-10-CM | POA: Diagnosis present

## 2021-12-02 DIAGNOSIS — O26899 Other specified pregnancy related conditions, unspecified trimester: Secondary | ICD-10-CM

## 2021-12-02 LAB — CBC
HCT: 31.1 % — ABNORMAL LOW (ref 36.0–46.0)
HCT: 31.9 % — ABNORMAL LOW (ref 36.0–46.0)
Hemoglobin: 10.3 g/dL — ABNORMAL LOW (ref 12.0–15.0)
Hemoglobin: 10.9 g/dL — ABNORMAL LOW (ref 12.0–15.0)
MCH: 27.2 pg (ref 26.0–34.0)
MCH: 27.9 pg (ref 26.0–34.0)
MCHC: 33.1 g/dL (ref 30.0–36.0)
MCHC: 34.2 g/dL (ref 30.0–36.0)
MCV: 81.8 fL (ref 80.0–100.0)
MCV: 82.3 fL (ref 80.0–100.0)
Platelets: 211 10*3/uL (ref 150–400)
Platelets: 235 10*3/uL (ref 150–400)
RBC: 3.78 MIL/uL — ABNORMAL LOW (ref 3.87–5.11)
RBC: 3.9 MIL/uL (ref 3.87–5.11)
RDW: 14.5 % (ref 11.5–15.5)
RDW: 14.6 % (ref 11.5–15.5)
WBC: 10.6 10*3/uL — ABNORMAL HIGH (ref 4.0–10.5)
WBC: 12.1 10*3/uL — ABNORMAL HIGH (ref 4.0–10.5)
nRBC: 0 % (ref 0.0–0.2)
nRBC: 0 % (ref 0.0–0.2)

## 2021-12-02 LAB — COMPREHENSIVE METABOLIC PANEL
ALT: 11 U/L (ref 0–44)
AST: 17 U/L (ref 15–41)
Albumin: 2.7 g/dL — ABNORMAL LOW (ref 3.5–5.0)
Alkaline Phosphatase: 175 U/L — ABNORMAL HIGH (ref 38–126)
Anion gap: 10 (ref 5–15)
BUN: 10 mg/dL (ref 6–20)
CO2: 21 mmol/L — ABNORMAL LOW (ref 22–32)
Calcium: 9.5 mg/dL (ref 8.9–10.3)
Chloride: 103 mmol/L (ref 98–111)
Creatinine, Ser: 0.64 mg/dL (ref 0.44–1.00)
GFR, Estimated: >60 mL/min (ref >60)
Glucose, Bld: 105 mg/dL — ABNORMAL HIGH (ref 70–99)
Potassium: 4 mmol/L (ref 3.5–5.1)
Sodium: 134 mmol/L — ABNORMAL LOW (ref 135–145)
Total Bilirubin: 0.2 mg/dL — ABNORMAL LOW (ref 0.3–1.2)
Total Protein: 6.6 g/dL (ref 6.5–8.1)

## 2021-12-02 LAB — TYPE AND SCREEN
ABO/RH(D): A NEG
Antibody Screen: NEGATIVE

## 2021-12-02 LAB — PROTEIN / CREATININE RATIO, URINE
Creatinine, Urine: 118.87 mg/dL
Protein Creatinine Ratio: 0.14 mg/mg{Cre} (ref 0.00–0.15)
Total Protein, Urine: 17 mg/dL

## 2021-12-02 LAB — RESP PANEL BY RT-PCR (FLU A&B, COVID) ARPGX2
Influenza A by PCR: NEGATIVE
Influenza B by PCR: NEGATIVE
SARS Coronavirus 2 by RT PCR: NEGATIVE

## 2021-12-02 LAB — RPR: RPR Ser Ql: NONREACTIVE

## 2021-12-02 MED ORDER — EPHEDRINE 5 MG/ML INJ: 10.0000 mg | INTRAVENOUS | Status: DC | PRN

## 2021-12-02 MED ORDER — OXYCODONE-ACETAMINOPHEN 5-325 MG PO TABS: 1.0000 | ORAL_TABLET | ORAL | Status: DC | PRN

## 2021-12-02 MED ORDER — LIDOCAINE HCL (PF) 1 % IJ SOLN: 30.0000 mL | INTRAMUSCULAR | Status: DC | PRN

## 2021-12-02 MED ORDER — OXYCODONE-ACETAMINOPHEN 5-325 MG PO TABS: 2.0000 | ORAL_TABLET | ORAL | Status: DC | PRN

## 2021-12-02 MED ORDER — SODIUM CHLORIDE 0.9 % IV SOLN
5.0000 10*6.[IU] | Freq: Once | INTRAVENOUS | Status: AC
  Administered 2021-12-02: 5 10*6.[IU] via INTRAVENOUS
  Filled 2021-12-02: qty 5

## 2021-12-02 MED ORDER — LACTATED RINGERS IV SOLN: INTRAVENOUS | Status: DC

## 2021-12-02 MED ORDER — ONDANSETRON HCL 4 MG/2ML IJ SOLN: 4.0000 mg | Freq: Four times a day (QID) | INTRAMUSCULAR | Status: DC | PRN

## 2021-12-02 MED ORDER — SOD CITRATE-CITRIC ACID 500-334 MG/5ML PO SOLN
30.0000 mL | ORAL | Status: DC | PRN
Start: 2021-12-02 — End: 2021-12-04

## 2021-12-02 MED ORDER — OXYTOCIN BOLUS FROM INFUSION: 333.0000 mL | Freq: Once | INTRAVENOUS | Status: DC

## 2021-12-02 MED ORDER — MISOPROSTOL 50MCG HALF TABLET
50.0000 ug | ORAL_TABLET | ORAL | Status: DC | PRN
  Administered 2021-12-02: 50 ug via ORAL
  Filled 2021-12-02: qty 1

## 2021-12-02 MED ORDER — ACETAMINOPHEN 325 MG PO TABS
650.0000 mg | ORAL_TABLET | ORAL | Status: DC | PRN
  Administered 2021-12-03: 650 mg via ORAL
  Filled 2021-12-02: qty 2

## 2021-12-02 MED ORDER — FENTANYL-BUPIVACAINE-NACL 0.5-0.125-0.9 MG/250ML-% EP SOLN
12.0000 mL/h | EPIDURAL | Status: DC | PRN
  Administered 2021-12-03 (×2): 12 mL/h via EPIDURAL
  Filled 2021-12-02 (×2): qty 250

## 2021-12-02 MED ORDER — PENICILLIN G POT IN DEXTROSE 60000 UNIT/ML IV SOLN
3.0000 10*6.[IU] | INTRAVENOUS | Status: DC
  Administered 2021-12-02 – 2021-12-03 (×11): 3 10*6.[IU] via INTRAVENOUS
  Filled 2021-12-02 (×11): qty 50

## 2021-12-02 MED ORDER — PHENYLEPHRINE 40 MCG/ML (10ML) SYRINGE FOR IV PUSH (FOR BLOOD PRESSURE SUPPORT)
80.0000 ug | PREFILLED_SYRINGE | INTRAVENOUS | Status: DC | PRN
  Filled 2021-12-02 (×2): qty 10

## 2021-12-02 MED ORDER — OXYTOCIN-SODIUM CHLORIDE 30-0.9 UT/500ML-% IV SOLN
2.5000 [IU]/h | INTRAVENOUS | Status: DC
  Filled 2021-12-02: qty 500

## 2021-12-02 MED ORDER — TERBUTALINE SULFATE 1 MG/ML IJ SOLN: 0.2500 mg | Freq: Once | INTRAMUSCULAR | Status: DC | PRN

## 2021-12-02 MED ORDER — LACTATED RINGERS IV SOLN: 500.0000 mL | INTRAVENOUS | Status: DC | PRN

## 2021-12-02 MED ORDER — DIPHENHYDRAMINE HCL 50 MG/ML IJ SOLN: 12.5000 mg | INTRAMUSCULAR | Status: DC | PRN

## 2021-12-02 MED ORDER — LACTATED RINGERS IV SOLN: 500.0000 mL | Freq: Once | INTRAVENOUS | Status: DC

## 2021-12-02 MED ORDER — FENTANYL CITRATE (PF) 100 MCG/2ML IJ SOLN
100.0000 ug | INTRAMUSCULAR | Status: DC | PRN
  Administered 2021-12-02 – 2021-12-03 (×6): 100 ug via INTRAVENOUS
  Filled 2021-12-02 (×7): qty 2

## 2021-12-02 MED ORDER — OXYTOCIN-SODIUM CHLORIDE 30-0.9 UT/500ML-% IV SOLN
1.0000 m[IU]/min | INTRAVENOUS | Status: DC
  Administered 2021-12-02: 4 m[IU]/min via INTRAVENOUS
  Administered 2021-12-02: 2 m[IU]/min via INTRAVENOUS
  Administered 2021-12-03: 24 m[IU]/min via INTRAVENOUS
  Filled 2021-12-02: qty 500

## 2021-12-02 MED ORDER — PHENYLEPHRINE 40 MCG/ML (10ML) SYRINGE FOR IV PUSH (FOR BLOOD PRESSURE SUPPORT): 80.0000 ug | PREFILLED_SYRINGE | INTRAVENOUS | Status: DC | PRN

## 2021-12-02 NOTE — Progress Notes (Signed)
Rhonda Ali is a 36 y.o. G3P1011 at [redacted]w[redacted]d.  Subjective: Mild-mod cramping. Coping well w/ Fentanyl.   Objective: BP 132/71    Pulse 85    Temp 98.2 F (36.8 C) (Oral)    Resp 18    Ht 5\' 4"  (1.626 m)    Wt 100.4 kg    LMP 03/04/2021    BMI 37.99 kg/m    FHT:  FHR: 145 bpm, variability: mod,  accelerations:  15x15,  decelerations:  none UC:   Q 2-5 minutes, mod Dilation: 4 Effacement (%): 30 Cervical Position: Anterior Station: Ballotable Presentation: Vertex Exam by:: Manya Silvas, CNM  Labs: NA  Assessment / Plan: [redacted]w[redacted]d week IUP Labor: Early. Titrate pitocin to achieve adequate labor.  Fetal Wellbeing:  Category I Pain Control:  Fentanyl Anticipated MOD:  SVD  Tamala Julian [place], [place] 12/02/2021 8:03 PM

## 2021-12-02 NOTE — Progress Notes (Signed)
Rhonda Ali is a 36 y.o. G3P1011 at [redacted]w[redacted]d admitted for IOL d/t gHTN  Subjective: Patient reports feeling mild contractions/cramping. No other concerns.  Objective: BP (!) 113/53    Pulse 77    Temp 98.8 F (37.1 C) (Oral)    Ht 5\' 4"  (1.626 m)    Wt 100.4 kg    LMP 03/04/2021    BMI 37.99 kg/m  No intake/output data recorded. No intake/output data recorded.  FHT: 145 bpm, moderate variability, +15x15 accels, no decels UC: Q 1.5-3 mins SVE:   Dilation: 1 Effacement (%): Thick Exam by:: Maryagnes Amos, CNM  Labs: Lab Results  Component Value Date   WBC 10.6 (H) 12/02/2021   HGB 10.9 (L) 12/02/2021   HCT 31.9 (L) 12/02/2021   MCV 81.8 12/02/2021   PLT 235 12/02/2021    Assessment / Plan: Tequlia Ali is a 36 y.o. G3P1011 at [redacted]w[redacted]d admitted for IOL d/t gHTN  Labor: S/p 1 cyto. Cook cath placed, only uterine balloon inflated. Patient tolerated procedure well. Contracting too frequently for second dose of cytotec. If contractions space out, may give additional dose gHTN: BP's normotensive, asymptomatic. Labs unremarkable Fetal Wellbeing:  Category I Pain Control:   desires epidural I/D:  GBS pos > PCN Anticipated MOD:  NSVD    Renee Harder, CNM 12/02/2021, 5:26 AM

## 2021-12-02 NOTE — Progress Notes (Signed)
Rhonda Ali is a 36 y.o. G3P1011 at [redacted]w[redacted]d.  Subjective: Mild cramping and pelvic pressure. Cook Cath still in place.   Objective: BP 122/67    Pulse 82    Temp 98.2 F (36.8 C) (Oral)    Resp 16    Ht 5\' 4"  (1.626 m)    Wt 100.4 kg    LMP 03/04/2021    BMI 37.99 kg/m    FHT:  FHR: 145 bpm, variability: mod,  accelerations:  15x15,  decelerations:  none UC:   Q 4-7 minutes, mild Dilation: 4 Effacement (%): Thick Cervical Position: Anterior Station: Ballotable Presentation: Vertex Exam by:: [email or web address], CNM Cook Cath in vagina, out of cervix.   Labs: NA  Assessment / Plan: [redacted]w[redacted]d week IUP Labor: Early. Cook cath now out. Start pitocin. AROM when head well- applied.  Fetal Wellbeing:  Category I Pain Control:  Comfort measures. Plans Epidural  Anticipated MOD:  SVD  [redacted]w[redacted]d Katrinka Blazing, [place] 12/02/2021 2:51 PM

## 2021-12-02 NOTE — Progress Notes (Signed)
Labor Progress Note Rhonda Ali is a 36 y.o. G3P1011 at [redacted]w[redacted]d presented for IOL due to gestational HTN.   S: Doing well, just had a dose of fentanyl.   O:  BP (!) 102/49    Pulse 79    Temp 98.8 F (37.1 C)    Resp 18    Ht 5\' 4"  (1.626 m)    Wt 100.4 kg    LMP 03/04/2021    BMI 37.99 kg/m  EFM: 135/mod/15x15/none  CVE: Dilation: 4.5 Effacement (%): 60 Cervical Position: Anterior Station: -3 Presentation: Vertex Exam by:: Dr. [email or web address]   [name]: 36 y.o. G3P1011 [redacted]w[redacted]d  #Labor: Some progression since last check. Head not well applied to cervix and posterior in pelvis. Cont to titrate pit as tolerated.  #Pain: Planning for epidural.  #FWB: Cat I  #GBS negative  #Gestational HTN: BP has WNL. No symptoms. Labs WNL. Cont to monitor.   [redacted]w[redacted]d, DO 11:55 PM

## 2021-12-02 NOTE — H&P (Signed)
OBSTETRIC ADMISSION HISTORY AND PHYSICAL  Rhonda Ali is a 36 y.o. female G3P1011 with IUP at [redacted]w[redacted]d by LMP presenting for IOL for gHTN. She reports +FMs, No LOF, no VB, no blurry vision, headaches or peripheral edema, and RUQ pain.  She plans on breast feeding. She request Nexplanon (outpatient) for birth control. She received her [facility]   Dating: By LMP --->  Estimated Date of Delivery: 12/09/21  Sono:    @[redacted]w[redacted]d , CWD, normal anatomy, cephalic presentation, posterior fundal placenta, 332g, 67% EFW   Prenatal History/Complications:  - gHTN - Rh negative - GBS bacteruria - Anemia  Past Medical History: Past Medical History:  Diagnosis Date   Moodiness 04/27/2013   Nausea & vomiting 04/27/2013    Past Surgical History: Past Surgical History:  Procedure Laterality Date   MOUTH SURGERY      Obstetrical History: OB History     Gravida  3   Para  1   Term  1   Preterm      AB  1   Living  1      SAB  1   IAB      Ectopic      Multiple      Live Births  1          Social History Social History   Socioeconomic History   Marital status: Married    Spouse name: Not on file   Number of children: 1   Years of education: Not on file   Highest education level: Not on file  Occupational History   Not on file  Tobacco Use   Smoking status: Former    Packs/day: 0.50    Years: 4.00    Pack years: 2.00    Types: Cigarettes   Smokeless tobacco: Never  Vaping Use   Vaping Use: Never used  Substance and Sexual Activity   Alcohol use: No   Drug use: No    Types: Marijuana   Sexual activity: Yes    Birth control/protection: None  Other Topics Concern   Not on file  Social History Narrative   Not on file   Social Determinants of Health   Financial Resource Strain: Low Risk    Difficulty of Paying Living Expenses: Not hard at all  Food Insecurity: No Food Insecurity   Worried About 04/29/2013 in the Last Year:  Never true   Ran Out of Food in the Last Year: Never true  Transportation Needs: No Transportation Needs   Lack of Transportation (Medical): No   Lack of Transportation (Non-Medical): No  Physical Activity: Insufficiently Active   Days of Exercise per Week: 4 days   Minutes of Exercise per Session: 20 min  Stress: No Stress Concern Present   Feeling of Stress : Only a little  Social Connections: Moderately Integrated   Frequency of Communication with Friends and Family: More than three times a week   Frequency of Social Gatherings with Friends and Family: More than three times a week   Attends Religious Services: More than 4 times per year   Active Member of Programme researcher, broadcasting/film/video or Organizations: No   Attends [employer] Meetings: Never   Marital Status: Married    Family History: Family History  Problem Relation Age of Onset   High Cholesterol Mother    Healthy Father    Heart disease Maternal Grandmother    Hypertension Maternal Grandmother    Cancer Maternal Grandfather  melanoma   Stroke Maternal Grandfather     Allergies: No Known Allergies  Medications Prior to Admission  Medication Sig Dispense Refill Last Dose   ferrous sulfate 325 (65 FE) MG tablet Take 1 tablet (325 mg total) by mouth every other day. 45 tablet 2    Prenatal Vit-Fe Fumarate-FA (PRENATAL VITAMINS) 28-0.8 MG TABS Take by mouth.      Review of Systems   All systems reviewed and negative except as stated in HPI  Blood pressure 137/67, pulse 97, temperature 98.4 F (36.9 C), temperature source Oral, last menstrual period 03/04/2021. General appearance: alert and no distress Lungs: effort normal Heart: regular rate Abdomen: soft, non-tender; gravid Extremities: no sign of DVT Presentation: cephalic Fetal monitoring: 140 bpm, moderate variability, +15x15 accels, no decels Uterine activity: Q 5-55mins with ui Dilation: 1 Effacement (%): Thick Exam by:: Camelia Eng, CNM  Prenatal  labs: ABO, Rh: --/--/PENDING (01/29 0020) Antibody: PENDING (01/29 0020) Rubella: 3.52 (07/25 1016) RPR: Non Reactive (11/15 0810)  HBsAg: Negative (07/25 1016)  HIV: Non Reactive (11/15 0810)  GBS: Negative/-- (01/13 1400)  2 hr Glucola: 83/169/137 Genetic screening: normal Anatomy US: normal  Prenatal Transfer Tool  Maternal Diabetes: No Genetic Screening: Normal Maternal Ultrasounds/Referrals: Normal Fetal Ultrasounds or other Referrals:  None Maternal Substance Abuse:  No Significant Maternal Medications:  None Significant Maternal Lab Results: Group B Strep positive and Rh negative  Results for orders placed or performed during the hospital encounter of 12/02/21 (from the past 24 hour(s))  CBC   Collection Time: 12/02/21 12:20 AM  Result Value Ref Range   WBC 10.6 (H) 4.0 - 10.5 K/uL   RBC 3.90 3.87 - 5.11 MIL/uL   Hemoglobin 10.9 (L) 12.0 - 15.0 g/dL   HCT 67.6 (L) 19.5 - 09.3 %   MCV 81.8 80.0 - 100.0 fL   MCH 27.9 26.0 - 34.0 pg   MCHC 34.2 30.0 - 36.0 g/dL   RDW 26.7 12.4 - 58.0 %   Platelets 235 150 - 400 K/uL   nRBC 0.0 0.0 - 0.2 %  Type and screen   Collection Time: 12/02/21 12:20 AM  Result Value Ref Range   ABO/RH(D) PENDING    Antibody Screen PENDING    Sample Expiration      12/05/2021,2359 Performed at [facility], [address] [place], [place] [address]     Patient Active Problem List   Diagnosis Date Noted   Encounter for supervision of high risk pregnancy in third trimester, antepartum 12/02/2021   Gestational hypertension 11/30/2021   Anemia during pregnancy 10/17/2021   GBS bacteriuria 07/02/2021   Asymptomatic bacteriuria 07/02/2021   Rh negative state in antepartum period 05/28/2021   Encounter for supervision of normal pregnancy, antepartum 05/22/2021   Smoker 06/27/2017    Assessment/Plan:  Rhonda Ali is a 36 y.o. G3P1011 at [redacted]w[redacted]d here for IOL d/t gHTN  #Labor: Plan for cytotec. Will attempt foley bulb placement  when able. #gHTN: BP normotensive, asymptomatic. CMP and UPCR pending  #Pain: desires epidural #FWB: Category 1 #ID: GBS positive > PCN #MOF: Breast  #MOC: Nexplanon (outpatient)  #Circ: Plans inpatient   Rhonda Ali, CNM  12/02/2021, 1:16 AM

## 2021-12-03 ENCOUNTER — Inpatient Hospital Stay (HOSPITAL_COMMUNITY): Payer: 59 | Admitting: Anesthesiology

## 2021-12-03 MED ORDER — BUPIVACAINE HCL (PF) 0.25 % IJ SOLN
INTRAMUSCULAR | Status: DC | PRN
  Administered 2021-12-03: 8 mL via EPIDURAL
  Administered 2021-12-03: 1 mL via EPIDURAL
  Administered 2021-12-04: 10 mL via EPIDURAL

## 2021-12-03 MED ORDER — TRANEXAMIC ACID-NACL 1000-0.7 MG/100ML-% IV SOLN: 1000.0000 mg | INTRAVENOUS | Status: AC

## 2021-12-03 MED ORDER — LIDOCAINE HCL (PF) 1 % IJ SOLN
INTRAMUSCULAR | Status: DC | PRN
  Administered 2021-12-03: 4 mL via EPIDURAL
  Administered 2021-12-03: 6 mL via EPIDURAL

## 2021-12-03 MED ORDER — FENTANYL CITRATE (PF) 100 MCG/2ML IJ SOLN
INTRAMUSCULAR | Status: DC | PRN
  Administered 2021-12-03: 100 ug via EPIDURAL

## 2021-12-03 MED ORDER — LACTATED RINGERS AMNIOINFUSION: INTRAVENOUS | Status: DC

## 2021-12-03 MED ORDER — CALCIUM CARBONATE ANTACID 500 MG PO CHEW
2.0000 | CHEWABLE_TABLET | Freq: Once | ORAL | Status: AC
  Administered 2021-12-03: 400 mg via ORAL
  Filled 2021-12-03: qty 2

## 2021-12-03 NOTE — Progress Notes (Signed)
Labor Progress Note Rhonda Ali is a 36 y.o. G3P1011 at [redacted]w[redacted]d presented for IOL due to gestational hypertension.   S: Doing much better after epidural placement.   O:  BP 129/62    Pulse 79    Temp 99.1 F (37.3 C) (Oral)    Resp 16    Ht 5\' 4"  (1.626 m)    Wt 100.4 kg    LMP 03/04/2021    SpO2 98%    BMI 37.99 kg/m  EFM: 135/mod/15x15/none  CVE: Dilation: 5.5 Effacement (%): 80 Cervical Position: Anterior Station: -3 Presentation: Vertex Exam by:: Dr. Higinio Plan   [name]: 36 y.o. [name] [redacted]w[redacted]d  #Labor: Some progression since last check but head still minimally applied to the cervix. Will continue titrating pit as needed, hopeful for AROM on next check.  #Pain: Epidural  #FWB: Cat I  #GBS positive  #Gestational hypertension: BP WNL.   [name], DO 8:42 AM

## 2021-12-03 NOTE — Progress Notes (Signed)
Labor Progress Note ESTALENE BERGEY is a 36 y.o. G3P1011 at [redacted]w[redacted]d presented for IOL for gHTN  S:  Comfortable with epidural, had recent dose of PCEA.  O:  BP 129/68    Pulse 84    Temp 99.1 F (37.3 C) (Oral)    Resp 16    Ht 5\' 4"  (1.626 m)    Wt 100.4 kg    LMP 03/04/2021    SpO2 98%    BMI 37.99 kg/m  EFM: baseline 135 bpm/ mod variability/ + accels/ no decels  Toco/IUPC: 2-4 SVE: Dilation: 5.5 Effacement (%): 80 Cervical Position: Anterior Station: -3 Presentation: Vertex Exam by:: Giovanne Nickolson, CNM Pitocin: 30 mu/min  A/P: 36 y.o. G3P1011 [redacted]w[redacted]d  1. Labor: early 2. FWB: Cat I 3. Pain: epidural 4. gHTN-stable  Consented for AROM. AROM for large amt of clear fluid. EFW by [place] 8.5 pounds. Continue Pitocin and will reduce dose. Anticipate labor progress and SVD.  [redacted]w[redacted]d, CNM 9:15 AM

## 2021-12-03 NOTE — Progress Notes (Signed)
Rhonda Ali is a 36 y.o. G3P1011 at [redacted]w[redacted]d by LMP admitted for induction of labor due to gHTN no meds.  Subjective: Pt doing well.  No questions or concerns.   Objective: Pt resting right lateral upon entry in the room.  BP 123/67    Pulse 78    Temp 99 F (37.2 C) (Oral)    Resp 16    Ht 5\' 4"  (1.626 m)    Wt 100.4 kg    LMP 03/04/2021    SpO2 100%    BMI 37.99 kg/m  No intake/output data recorded. Total I/O In: -  Out: 500 [Urine:500]  FHT:  FHR: 130 bpm, variability: moderate,  accelerations:  Abscent,  decelerations:  Present early, variables UC:   regular, every 5-6 minutes SVE: 6/80/-3 by me.    Labs: Lab Results  Component Value Date   WBC 12.1 (H) 12/02/2021   HGB 10.3 (L) 12/02/2021   HCT 31.1 (L) 12/02/2021   MCV 82.3 12/02/2021   PLT 211 12/02/2021    Assessment / Plan: Protracted latent phase:SVE with minimal change since 0311 (5-6), IUPC: MVU: inadequate, Pitocin on 22 mu.  At this time placed patient in exaggerated right sims.  Will continue pitocin per protocol and titrate up as indicated by contractions and FWB.  Fetal Wellbeing:  Category II Pain Control:  Epidural I/D:   GBS negative Anticipated MOD:  NSVD  Jebidiah Baggerly 12/03/2021, 1:44 PM

## 2021-12-03 NOTE — Anesthesia Procedure Notes (Signed)
Epidural Patient location during procedure: OB Start time: 12/03/2021 12:05 PM End time: 12/03/2021 12:08 PM  Staffing Anesthesiologist: Brennan Bailey, MD Performed: anesthesiologist   Preanesthetic Checklist Completed: patient identified, IV checked, risks and benefits discussed, monitors and equipment checked, pre-op evaluation and timeout performed  Epidural Patient position: sitting Prep: DuraPrep and site prepped and draped Patient monitoring: heart rate, continuous pulse ox and blood pressure Approach: midline Location: L2-L3 Injection technique: LOR air  Needle:  Needle type: Tuohy  Needle gauge: 17 G Needle length: 9 cm Needle insertion depth: 7 cm Catheter type: closed end flexible Catheter size: 19 Gauge Catheter at skin depth: 12 cm  Assessment Events: blood not aspirated, injection not painful, no injection resistance, no paresthesia and negative IV test  Additional Notes Patient identified. Risks, benefits, and alternatives discussed with patient including but not limited to bleeding, infection, nerve damage, paralysis, failed block, incomplete pain control, headache, blood pressure changes, nausea, vomiting, reactions to medication, itching, and postpartum back pain. Confirmed with bedside nurse the patient's most recent platelet count. Confirmed with patient that they are not currently taking any anticoagulation, have any bleeding history, or any family history of bleeding disorders. Patient expressed understanding and wished to proceed. All questions were answered. Sterile technique was used throughout the entire procedure. Please see nursing notes for vital signs.   Previous epidural catheter removed with tip intact. Replaced under sterile conditions. Crisp LOR with Tuohy needle on first pass. [place] 25g 175mm spinal needle introduced through Tuohy with clear CSF return prior to injection of intrathecal medication. Spinal needle withdrawn and epidural catheter  threaded easily. Negative aspiration of catheter for heme or CSF prior to starting epidural infusion. Warning signs of high block given to the patient including shortness of breath, tingling/numbness in hands, complete motor block, or any concerning symptoms with instructions to call for help. Patient was given instructions on fall risk and not to get out of bed. All questions and concerns addressed with instructions to call with any issues or inadequate analgesia.  Patient comfortable with contractions prior to my leaving room. Reason for block:procedure for pain

## 2021-12-03 NOTE — Anesthesia Preprocedure Evaluation (Signed)
Anesthesia Evaluation  Patient identified by MRN, date of birth, ID band Patient awake    Reviewed: Allergy & Precautions, H&P , NPO status , Patient's Chart, lab work & pertinent test results  History of Anesthesia Complications Negative for: history of anesthetic complications  Airway Mallampati: II  TM Distance: >3 FB     Dental   Pulmonary neg pulmonary ROS, former smoker,    Pulmonary exam normal        Cardiovascular hypertension,  Rhythm:regular Rate:Normal     Neuro/Psych negative neurological ROS  negative psych ROS   GI/Hepatic negative GI ROS, Neg liver ROS,   Endo/Other  negative endocrine ROS  Renal/GU negative Renal ROS  negative genitourinary   Musculoskeletal   Abdominal   Peds  Hematology  (+) Blood dyscrasia, anemia ,   Anesthesia Other Findings   Reproductive/Obstetrics (+) Pregnancy                             Anesthesia Physical Anesthesia Plan  ASA: 2  Anesthesia Plan: Epidural   Post-op Pain Management:    Induction:   PONV Risk Score and Plan:   Airway Management Planned:   Additional Equipment:   Intra-op Plan:   Post-operative Plan:   Informed Consent: I have reviewed the patients History and Physical, chart, labs and discussed the procedure including the risks, benefits and alternatives for the proposed anesthesia with the patient or authorized representative who has indicated his/her understanding and acceptance.       Plan Discussed with:   Anesthesia Plan Comments:         Anesthesia Quick Evaluation

## 2021-12-03 NOTE — Anesthesia Procedure Notes (Signed)
Epidural Patient location during procedure: OB Start time: 12/03/2021 3:25 AM End time: 12/03/2021 3:35 AM  Staffing Anesthesiologist: Lucretia Kern, MD Performed: anesthesiologist   Preanesthetic Checklist Completed: patient identified, IV checked, risks and benefits discussed, monitors and equipment checked, pre-op evaluation and timeout performed  Epidural Patient position: sitting Prep: DuraPrep Patient monitoring: heart rate, continuous pulse ox and blood pressure Approach: midline Location: L3-L4 Injection technique: LOR air  Needle:  Needle type: Tuohy  Needle gauge: 17 G Needle length: 9 cm Needle insertion depth: 7 cm Catheter type: closed end flexible Catheter size: 19 Gauge Catheter at skin depth: 12 cm Test dose: negative  Assessment Events: blood not aspirated, injection not painful, no injection resistance, no paresthesia and negative IV test  Additional Notes Reason for block:procedure for pain

## 2021-12-03 NOTE — Progress Notes (Signed)
Labor Progress Note   Called by RN that patient was feeling more vaginal pressure with most recent contractions. Patient is trying to decide on additional fentanyl vs transitioning to epidural.   Evaluated at bedside. Cervix 5/60-70/-3. Head minimal applied but present. FHT Cat I. Patient planning for epidural.    Allayne Stack, DO

## 2021-12-03 NOTE — Progress Notes (Shared)
Labor Progress Note Rhonda Ali is a 36 y.o. G3P1011 at [redacted]w[redacted]d presented for IOL  S:   O:  BP (!) 133/55    Pulse 75    Temp 99 F (37.2 C) (Oral)    Resp 18    Ht 5\' 4"  (1.626 m)    Wt 100.4 kg    LMP 03/04/2021    SpO2 100%    BMI 37.99 kg/m  EFM: ***/***/***  CVE: Dilation: 6 Effacement (%): 80 Cervical Position: Anterior Station: -3 Presentation: Vertex Exam by:: Dr. [email or web address]   [name]: 36 y.o. 32 [redacted]w[redacted]d *** #Labor: Progressing well. *** #Pain:  #FWB: *** #GBS {pos/neg/not [redacted]w[redacted]d *** (chronic/other problems)  [id number]}, DO 10:03 PM

## 2021-12-03 NOTE — Progress Notes (Addendum)
Rhonda Ali is a 36 y.o. G3P1011 at [redacted]w[redacted]d by LMP admitted for induction of labor due to gHTN no meds.  Subjective: Pt doing well. No questions or concerns.  Pt asked to switch to right lateral, reports left hand numbness and tingling.   Objective: Pt resting in left lateral, no pain. Husband at bedside. BP 127/67    Pulse 83    Temp 98.8 F (37.1 C) (Axillary)    Resp 16    Ht 5\' 4"  (1.626 m)    Wt 100.4 kg    LMP 03/04/2021    SpO2 100%    BMI 37.99 kg/m  No intake/output data recorded. Total I/O In: -  Out: 500 [Urine:500]  FHT:  FHR: 130's bpm, variability: moderate,  accelerations:  Present,  decelerations:  Present early UC:   regular, every 2-5 minutes, palpates mild-moderate SVE:   Dilation: 6 Effacement (%): 80 Station: -3 Exam by:: Ali, SNM  Labs: Lab Results  Component Value Date   WBC 12.1 (H) 12/02/2021   HGB 10.3 (L) 12/02/2021   HCT 31.1 (L) 12/02/2021   MCV 82.3 12/02/2021   PLT 211 12/02/2021    Assessment / Plan: Protracted active phase: at this time will stop pitocin x 1 hour and give 1,000 mg Tums PO to desaturate uterine receptors.  Will restart low dose Pitocin protocol. Discussed plan with patient and partner, agrees to plan and no questions.   Fetal Wellbeing:  Category II Pain Control:  Epidural I/D:   GBS negative Anticipated MOD:  NSVD  Rhonda Ali 12/03/2021, 4:00 PM  Midwife attestation I agree with the documentation in the student's note.   12/05/2021, CNM 6:11 PM

## 2021-12-03 NOTE — Progress Notes (Signed)
Notified by RN of recurrent variables. S: Having more pain in lower abdomen, just had anesthesia redose.  O: VSS, NAD EFM: 135 bpm, mod variability, no accels, variable decels Toco: q2-3  A/P: FWB: Cat II  IUPC placed, start amnioinfusion Anticipate labor progress and SVD

## 2021-12-04 ENCOUNTER — Other Ambulatory Visit: Payer: Self-pay

## 2021-12-04 ENCOUNTER — Encounter (HOSPITAL_COMMUNITY)
Admission: AD | Disposition: A | Discharge status: Home / Self Care | Payer: Self-pay | Source: Home / Self Care | Attending: Obstetrics and Gynecology

## 2021-12-04 ENCOUNTER — Encounter (HOSPITAL_COMMUNITY): Payer: Self-pay | Admitting: Obstetrics & Gynecology

## 2021-12-04 DIAGNOSIS — O9902 Anemia complicating childbirth: Secondary | ICD-10-CM | POA: Diagnosis not present

## 2021-12-04 DIAGNOSIS — Z3A39 39 weeks gestation of pregnancy: Secondary | ICD-10-CM

## 2021-12-04 DIAGNOSIS — O134 Gestational [pregnancy-induced] hypertension without significant proteinuria, complicating childbirth: Secondary | ICD-10-CM | POA: Diagnosis not present

## 2021-12-04 DIAGNOSIS — O99824 Streptococcus B carrier state complicating childbirth: Secondary | ICD-10-CM | POA: Diagnosis not present

## 2021-12-04 LAB — CBC
HCT: 27.9 % — ABNORMAL LOW (ref 36.0–46.0)
Hemoglobin: 9.1 g/dL — ABNORMAL LOW (ref 12.0–15.0)
MCH: 27 pg (ref 26.0–34.0)
MCHC: 32.6 g/dL (ref 30.0–36.0)
MCV: 82.8 fL (ref 80.0–100.0)
Platelets: 179 10*3/uL (ref 150–400)
RBC: 3.37 MIL/uL — ABNORMAL LOW (ref 3.87–5.11)
RDW: 14.5 % (ref 11.5–15.5)
WBC: 16.4 10*3/uL — ABNORMAL HIGH (ref 4.0–10.5)
nRBC: 0 % (ref 0.0–0.2)

## 2021-12-04 SURGERY — Surgical Case
Anesthesia: Epidural

## 2021-12-04 MED ORDER — KETOROLAC TROMETHAMINE 30 MG/ML IJ SOLN: 30.0000 mg | Freq: Four times a day (QID) | INTRAMUSCULAR | Status: DC | PRN

## 2021-12-04 MED ORDER — KETOROLAC TROMETHAMINE 30 MG/ML IJ SOLN
INTRAMUSCULAR | Status: AC
  Filled 2021-12-04: qty 1

## 2021-12-04 MED ORDER — OXYCODONE HCL 5 MG PO TABS: 5.0000 mg | ORAL_TABLET | ORAL | Status: DC | PRN

## 2021-12-04 MED ORDER — MORPHINE SULFATE (PF) 0.5 MG/ML IJ SOLN
INTRAMUSCULAR | Status: DC | PRN
  Administered 2021-12-04: 3 mg via EPIDURAL

## 2021-12-04 MED ORDER — ONDANSETRON HCL 4 MG/2ML IJ SOLN
INTRAMUSCULAR | Status: DC | PRN
  Administered 2021-12-04: 4 mg via INTRAVENOUS

## 2021-12-04 MED ORDER — MENTHOL 3 MG MT LOZG: 1.0000 | LOZENGE | OROMUCOSAL | Status: DC | PRN

## 2021-12-04 MED ORDER — DIBUCAINE (PERIANAL) 1 % EX OINT: 1.0000 "application " | TOPICAL_OINTMENT | CUTANEOUS | Status: DC | PRN

## 2021-12-04 MED ORDER — ACETAMINOPHEN 10 MG/ML IV SOLN
1000.0000 mg | Freq: Once | INTRAVENOUS | Status: DC | PRN
  Administered 2021-12-04: 1000 mg via INTRAVENOUS

## 2021-12-04 MED ORDER — FENTANYL CITRATE (PF) 100 MCG/2ML IJ SOLN: 25.0000 ug | INTRAMUSCULAR | Status: DC | PRN

## 2021-12-04 MED ORDER — SENNOSIDES-DOCUSATE SODIUM 8.6-50 MG PO TABS
2.0000 | ORAL_TABLET | Freq: Every day | ORAL | Status: DC
  Administered 2021-12-05 – 2021-12-06 (×2): 2 via ORAL
  Filled 2021-12-04 (×2): qty 2

## 2021-12-04 MED ORDER — DIPHENHYDRAMINE HCL 25 MG PO CAPS
25.0000 mg | ORAL_CAPSULE | Freq: Four times a day (QID) | ORAL | Status: DC | PRN
Start: 2021-12-04 — End: 2021-12-06
  Administered 2021-12-04: 25 mg via ORAL

## 2021-12-04 MED ORDER — DEXAMETHASONE SODIUM PHOSPHATE 10 MG/ML IJ SOLN
INTRAMUSCULAR | Status: DC | PRN
Start: 2021-12-04 — End: 2021-12-04
  Administered 2021-12-04: 10 mg via INTRAVENOUS

## 2021-12-04 MED ORDER — LIDOCAINE-EPINEPHRINE (PF) 2 %-1:200000 IJ SOLN
INTRAMUSCULAR | Status: DC | PRN
  Administered 2021-12-04 (×2): 5 mL via EPIDURAL

## 2021-12-04 MED ORDER — SOD CITRATE-CITRIC ACID 500-334 MG/5ML PO SOLN
30.0000 mL | ORAL | Status: AC
  Administered 2021-12-04: 30 mL via ORAL
  Filled 2021-12-04: qty 30

## 2021-12-04 MED ORDER — ACETAMINOPHEN 500 MG PO TABS: 1000.0000 mg | ORAL_TABLET | Freq: Four times a day (QID) | ORAL | Status: DC

## 2021-12-04 MED ORDER — CEFAZOLIN SODIUM-DEXTROSE 2-4 GM/100ML-% IV SOLN
2.0000 g | INTRAVENOUS | Status: AC
  Administered 2021-12-04: 2 g via INTRAVENOUS

## 2021-12-04 MED ORDER — HYDROMORPHONE HCL 1 MG/ML IJ SOLN: 0.2000 mg | INTRAMUSCULAR | Status: DC | PRN

## 2021-12-04 MED ORDER — FENTANYL CITRATE (PF) 100 MCG/2ML IJ SOLN
INTRAMUSCULAR | Status: AC
  Filled 2021-12-04: qty 2

## 2021-12-04 MED ORDER — WITCH HAZEL-GLYCERIN EX PADS: 1.0000 "application " | MEDICATED_PAD | CUTANEOUS | Status: DC | PRN

## 2021-12-04 MED ORDER — SIMETHICONE 80 MG PO CHEW: 80.0000 mg | CHEWABLE_TABLET | ORAL | Status: DC | PRN

## 2021-12-04 MED ORDER — ZOLPIDEM TARTRATE 5 MG PO TABS: 5.0000 mg | ORAL_TABLET | Freq: Every evening | ORAL | Status: DC | PRN

## 2021-12-04 MED ORDER — SIMETHICONE 80 MG PO CHEW
80.0000 mg | CHEWABLE_TABLET | Freq: Three times a day (TID) | ORAL | Status: DC
  Administered 2021-12-04 – 2021-12-06 (×7): 80 mg via ORAL
  Filled 2021-12-04 (×7): qty 1

## 2021-12-04 MED ORDER — OXYTOCIN-SODIUM CHLORIDE 30-0.9 UT/500ML-% IV SOLN: 2.5000 [IU]/h | INTRAVENOUS | Status: AC

## 2021-12-04 MED ORDER — KETOROLAC TROMETHAMINE 30 MG/ML IJ SOLN
30.0000 mg | Freq: Once | INTRAMUSCULAR | Status: AC | PRN
  Administered 2021-12-04: 30 mg via INTRAVENOUS

## 2021-12-04 MED ORDER — SODIUM CHLORIDE 0.9 % IV SOLN
500.0000 mg | INTRAVENOUS | Status: AC
  Administered 2021-12-04: 500 mg via INTRAVENOUS

## 2021-12-04 MED ORDER — IBUPROFEN 600 MG PO TABS
600.0000 mg | ORAL_TABLET | Freq: Four times a day (QID) | ORAL | Status: DC
  Administered 2021-12-05 – 2021-12-06 (×5): 600 mg via ORAL
  Filled 2021-12-04 (×6): qty 1

## 2021-12-04 MED ORDER — SCOPOLAMINE 1 MG/3DAYS TD PT72: 1.0000 | MEDICATED_PATCH | Freq: Once | TRANSDERMAL | Status: DC

## 2021-12-04 MED ORDER — ACETAMINOPHEN 500 MG PO TABS
1000.0000 mg | ORAL_TABLET | Freq: Four times a day (QID) | ORAL | Status: DC
  Administered 2021-12-04 – 2021-12-06 (×7): 1000 mg via ORAL
  Filled 2021-12-04 (×9): qty 2

## 2021-12-04 MED ORDER — ONDANSETRON HCL 4 MG/2ML IJ SOLN
INTRAMUSCULAR | Status: AC
  Filled 2021-12-04: qty 2

## 2021-12-04 MED ORDER — ONDANSETRON HCL 4 MG/2ML IJ SOLN: 4.0000 mg | Freq: Three times a day (TID) | INTRAMUSCULAR | Status: DC | PRN

## 2021-12-04 MED ORDER — PRENATAL MULTIVITAMIN CH
1.0000 | ORAL_TABLET | Freq: Every day | ORAL | Status: DC
  Administered 2021-12-04 – 2021-12-06 (×3): 1 via ORAL
  Filled 2021-12-04 (×3): qty 1

## 2021-12-04 MED ORDER — ACETAMINOPHEN 10 MG/ML IV SOLN
INTRAVENOUS | Status: AC
  Filled 2021-12-04: qty 100

## 2021-12-04 MED ORDER — COCONUT OIL OIL: 1.0000 "application " | TOPICAL_OIL | Status: DC | PRN

## 2021-12-04 MED ORDER — ENOXAPARIN SODIUM 40 MG/0.4ML IJ SOSY: 40.0000 mg | PREFILLED_SYRINGE | INTRAMUSCULAR | Status: DC

## 2021-12-04 MED ORDER — SODIUM CHLORIDE 0.9 % IV SOLN
INTRAVENOUS | Status: AC
  Filled 2021-12-04: qty 5

## 2021-12-04 MED ORDER — NALOXONE HCL 0.4 MG/ML IJ SOLN: 0.4000 mg | INTRAMUSCULAR | Status: DC | PRN

## 2021-12-04 MED ORDER — LACTATED RINGERS IV SOLN: INTRAVENOUS | Status: DC

## 2021-12-04 MED ORDER — LACTATED RINGERS IV BOLUS
500.0000 mL | Freq: Once | INTRAVENOUS | Status: AC
  Administered 2021-12-04: 500 mL via INTRAVENOUS

## 2021-12-04 MED ORDER — ENOXAPARIN SODIUM 60 MG/0.6ML IJ SOSY
50.0000 mg | PREFILLED_SYRINGE | INTRAMUSCULAR | Status: DC
  Administered 2021-12-04 – 2021-12-05 (×2): 50 mg via SUBCUTANEOUS
  Filled 2021-12-04 (×2): qty 0.6

## 2021-12-04 MED ORDER — DIPHENHYDRAMINE HCL 50 MG/ML IJ SOLN: 12.5000 mg | INTRAMUSCULAR | Status: DC | PRN

## 2021-12-04 MED ORDER — FENTANYL CITRATE (PF) 100 MCG/2ML IJ SOLN
INTRAMUSCULAR | Status: DC | PRN
  Administered 2021-12-04: 100 ug via EPIDURAL

## 2021-12-04 MED ORDER — DIPHENHYDRAMINE HCL 25 MG PO CAPS
25.0000 mg | ORAL_CAPSULE | ORAL | Status: DC | PRN
  Filled 2021-12-04: qty 1

## 2021-12-04 MED ORDER — MORPHINE SULFATE (PF) 0.5 MG/ML IJ SOLN
INTRAMUSCULAR | Status: AC
  Filled 2021-12-04: qty 10

## 2021-12-04 MED ORDER — KETOROLAC TROMETHAMINE 30 MG/ML IJ SOLN
30.0000 mg | Freq: Four times a day (QID) | INTRAMUSCULAR | Status: AC
  Administered 2021-12-04 (×3): 30 mg via INTRAVENOUS
  Filled 2021-12-04 (×3): qty 1

## 2021-12-04 MED ORDER — SODIUM CHLORIDE 0.9% FLUSH: 3.0000 mL | INTRAVENOUS | Status: DC | PRN

## 2021-12-04 MED ORDER — OXYTOCIN-SODIUM CHLORIDE 30-0.9 UT/500ML-% IV SOLN
INTRAVENOUS | Status: DC | PRN
  Administered 2021-12-04: 30 [IU] via INTRAVENOUS

## 2021-12-04 MED ORDER — DEXAMETHASONE SODIUM PHOSPHATE 10 MG/ML IJ SOLN
INTRAMUSCULAR | Status: AC
  Filled 2021-12-04: qty 1

## 2021-12-04 MED ORDER — NALOXONE HCL 4 MG/10ML IJ SOLN
1.0000 ug/kg/h | INTRAVENOUS | Status: DC | PRN
  Filled 2021-12-04: qty 5

## 2021-12-04 SURGICAL SUPPLY — 35 items
APL SKNCLS STERI-STRIP NONHPOA (GAUZE/BANDAGES/DRESSINGS) ×1
BENZOIN TINCTURE PRP APPL 2/3 (GAUZE/BANDAGES/DRESSINGS) ×1 IMPLANT
CHLORAPREP W/TINT 26ML (MISCELLANEOUS) ×2 IMPLANT
CLAMP CORD UMBIL (MISCELLANEOUS) IMPLANT
CLOSURE STERI STRIP 1/2 X4 (GAUZE/BANDAGES/DRESSINGS) ×1 IMPLANT
CLOTH BEACON ORANGE TIMEOUT ST (SAFETY) ×2 IMPLANT
DRSG OPSITE POSTOP 4X10 (GAUZE/BANDAGES/DRESSINGS) ×2 IMPLANT
DRSG OPSITE POSTOP 4X8 (GAUZE/BANDAGES/DRESSINGS) ×1 IMPLANT
ELECT REM PT RETURN 9FT ADLT (ELECTROSURGICAL) ×2
ELECTRODE REM PT RTRN 9FT ADLT (ELECTROSURGICAL) ×1 IMPLANT
EXTRACTOR VACUUM KIWI (MISCELLANEOUS) IMPLANT
GLOVE SURG ENC MOIS LTX SZ6 (GLOVE) ×2 IMPLANT
GLOVE SURG UNDER POLY LF SZ7 (GLOVE) ×4 IMPLANT
GOWN STRL REUS W/TWL LRG LVL3 (GOWN DISPOSABLE) ×4 IMPLANT
KIT ABG SYR 3ML LUER SLIP (SYRINGE) IMPLANT
NDL HYPO 25X5/8 SAFETYGLIDE (NEEDLE) IMPLANT
NEEDLE HYPO 22GX1.5 SAFETY (NEEDLE) IMPLANT
NEEDLE HYPO 25X5/8 SAFETYGLIDE (NEEDLE) IMPLANT
NS IRRIG 1000ML POUR BTL (IV SOLUTION) ×2 IMPLANT
PACK C SECTION WH (CUSTOM PROCEDURE TRAY) ×2 IMPLANT
PAD OB MATERNITY 4.3X12.25 (PERSONAL CARE ITEMS) ×2 IMPLANT
PENCIL SMOKE EVAC W/HOLSTER (ELECTROSURGICAL) ×2 IMPLANT
RETRACTOR WND ALEXIS 25 LRG (MISCELLANEOUS) IMPLANT
RTRCTR WOUND ALEXIS 25CM LRG (MISCELLANEOUS)
SUT PLAIN 0 NONE (SUTURE) ×2 IMPLANT
SUT PLAIN 2 0 (SUTURE) ×2
SUT PLAIN ABS 2-0 CT1 27XMFL (SUTURE) IMPLANT
SUT VIC AB 0 CT1 36 (SUTURE) ×6 IMPLANT
SUT VIC AB 2-0 CT1 27 (SUTURE) ×2
SUT VIC AB 2-0 CT1 TAPERPNT 27 (SUTURE) ×1 IMPLANT
SUT VIC AB 4-0 KS 27 (SUTURE) ×2 IMPLANT
SYR CONTROL 10ML LL (SYRINGE) IMPLANT
TOWEL OR 17X24 6PK STRL BLUE (TOWEL DISPOSABLE) ×2 IMPLANT
TRAY FOLEY W/BAG SLVR 14FR LF (SET/KITS/TRAYS/PACK) IMPLANT
WATER STERILE IRR 1000ML POUR (IV SOLUTION) ×3 IMPLANT

## 2021-12-04 NOTE — Op Note (Signed)
Rhonda Ali PROCEDURE DATE: 12/04/2021  PREOPERATIVE DIAGNOSES: Intrauterine pregnancy at [redacted]w[redacted]d weeks gestation; failure to progress: arrest of dilation (6cm)  POSTOPERATIVE DIAGNOSES: The same, OP fetal positioning   PROCEDURE: Primary Low Transverse Cesarean Section  SURGEON:  Dr. Milas Hock  ASSISTANT:  Dr. Leticia Penna   ANESTHESIOLOGY TEAM: Anesthesiologist: Kaylyn Layer, MD; Lucretia Kern, MD; Elmer Picker, MD CRNA: Lenox Ahr, CRNA  INDICATIONS: Rhonda Ali is a 36 y.o. [phone] at [redacted]w[redacted]d here for cesarean section secondary to the indications listed under preoperative diagnoses; please see preoperative note for further details.  The risks of surgery were discussed with the patient including but were not limited to: bleeding which may require transfusion or reoperation; infection which may require antibiotics; injury to bowel, bladder, ureters or other surrounding organs; injury to the fetus; need for additional procedures including hysterectomy in the event of a life-threatening hemorrhage; formation of adhesions; placental abnormalities wth subsequent pregnancies; incisional problems; thromboembolic phenomenon and other postoperative/anesthesia complications.  The patient concurred with the proposed plan, giving informed written consent for the procedure.    FINDINGS:  Viable female infant in cephalic OP presentation.  Apgars 8 and 9.  Amniotic fluid: clear.  Intact placenta, three vessel cord.  Normal uterus, fallopian tubes and ovaries bilaterally.  ANESTHESIA: epidural INTRAVENOUS FLUIDS: 1,000 ml   ESTIMATED BLOOD LOSS: 314 ml URINE OUTPUT:  100 ml SPECIMENS: Placenta sent to L&D  COMPLICATIONS: None immediate  PROCEDURE IN DETAIL:  The patient preoperatively received intravenous antibiotics and had sequential compression devices applied to her lower extremities.  She was then taken to the operating room where the epidural was dosed up to a surgical  level and found to be adequate. She was then placed in a dorsal supine position with a leftward tilt, and prepped and draped in a sterile manner.  A foley catheter was already in place.  After an adequate timeout was performed, a Pfannenstiel skin incision was made with scalpel and carried through to the underlying layer of fascia. The fascia was incised in the midline, and this incision was extended bluntly. The rectus muscles were separated in the midline and the peritoneum was entered bluntly.   The rich retractor and bladder blade were introduced into the abdominal cavity.  Attention was turned to the lower uterine segment where a low transverse hysterotomy was made with a scalpel and extended bilaterally bluntly.  The infant was successfully delivered, the cord was clamped and cut after one minute, and the infant was handed over to the awaiting neonatology team. Uterine massage was then administered, and the placenta delivered intact with a three-vessel cord. The uterus was exteriorized.The uterus was then cleared of clots and debris.  The hysterotomy was closed with 0 Vicryl in a running locked fashion, and an imbricating layer was also placed with 0 Vicryl.  Figure-of-eight 0 Vicryl serosal stitches were placed to help with hemostasis.    The pelvis was cleared of all clot and debris. Hemostasis was confirmed on all surfaces. The peritoneum was closed with a 2-0 Vicryl running stitch. The fascia was then closed using 0 Vicryl in a running fashion.  The subcutaneous layer was irrigated, was reapproximated with 2-0 plain gut in a running fashion. The skin was closed with a 4-0 Vicryl subcuticular stitch. The patient tolerated the procedure well. Sponge, instrument and needle counts were correct x 3.  She was taken to the recovery room in stable condition.    Allayne Stack, DO

## 2021-12-04 NOTE — Lactation Note (Signed)
This note was copied from a baby's chart. Lactation Consultation Note  Patient Name: Rhonda Ali [id number] Date: 12/04/2021 Reason for consult: Follow-up assessment;Term;Mother's request Age:34 hours  Mom called out for latch assistance.  Baby had just latched in football hold.  Mom pulling her breast away from baby's nose.  LC felt baby's latch was deep and lips were flanged, but added pillow support and guided Mom into straightening baby's neck so his chin would be into breast and nose just touching.  Mom to avoid pulling breast away from baby's nose to avoid nipple soreness.  Mom taught breast compression to increase milk transfer.  Encouraged STS and offering the breast with cues.  Mom to ask for help prn.  LATCH Score Latch: Grasps breast easily, tongue down, lips flanged, rhythmical sucking.  Audible Swallowing: Spontaneous and intermittent  Type of Nipple: Everted at rest and after stimulation  Comfort (Breast/Nipple): Soft / non-tender  Hold (Positioning): Assistance needed to correctly position infant at breast and maintain latch. (Assisted with baby's head positioning)  LATCH Score: 9  Interventions Interventions: Breast feeding basics reviewed;Assisted with latch;Skin to skin;Breast massage;Hand express;Adjust position;Breast compression;Support pillows;Position options  Consult Status Consult Status: Follow-up Date: 12/05/21 Follow-up type: In-patient    Broadus John 12/04/2021, 12:21 PM

## 2021-12-04 NOTE — Progress Notes (Addendum)
Labor Progress Note   Rechecked in with patient with Dr. Damita Dunnings. Patient would like to proceed with C/S for arrest of dilation at 6cm. Pit discontinued. FHT Cat I.   The risks of cesarean section discussed with the patient included but were not limited to: bleeding which may require transfusion or reoperation; infection which may require antibiotics; injury to bowel, bladder, ureters or other surrounding organs; injury to the fetus; need for additional procedures including hysterectomy in the event of a life-threatening hemorrhage; placental abnormalities wth subsequent pregnancies, incisional problems, thromboembolic phenomenon and other postoperative/anesthesia complications. The patient concurred with the proposed plan, giving informed written consent for the procedure. Anesthesia and OR aware. Preoperative prophylactic antibiotics and SCDs ordered on call to the OR. To OR when ready.    [name], DO   In addition to as noted by Dr. Higinio Plan, discussed with the patient the option for BTS vs LARC. After reviewing the permanent nature of BTS and risk of regret she would like to wait on that and do Nexplanon. She has had good success with that (poor experience with Mirena on two different occasions).  We reviewed risk of surgery as noted above.   Radene Gunning, MD Attending [place], [facility] for [facility], [place]

## 2021-12-04 NOTE — Anesthesia Postprocedure Evaluation (Signed)
Anesthesia Post Note  Patient: Hospital doctor E Worst  Procedure(s) Performed: CESAREAN SECTION     Patient location during evaluation: PACU Anesthesia Type: Epidural Level of consciousness: oriented and awake and alert Pain management: pain level controlled Vital Signs Assessment: post-procedure vital signs reviewed and stable Respiratory status: spontaneous breathing, respiratory function stable and patient connected to nasal cannula oxygen Cardiovascular status: blood pressure returned to baseline and stable Postop Assessment: no headache, no backache, no apparent nausea or vomiting and epidural receding Anesthetic complications: no   No notable events documented.  Last Vitals:  Vitals:   12/04/21 0510 12/04/21 0610  BP: 116/63 127/64  Pulse: 77 75  Resp: 18 17  Temp: 37.1 C 36.7 C  SpO2: 98% 96%    Last Pain:  Vitals:   12/04/21 0610  TempSrc: Oral  PainSc: 0-No pain   Pain Goal:                Epidural/Spinal Function Cutaneous sensation: Able to Wiggle Toes (12/04/21 0610), Patient able to flex knees: Yes (12/04/21 0610), Patient able to lift hips off bed: Yes (12/04/21 0610), Back pain beyond tenderness at insertion site: No (12/04/21 0610), Progressively worsening motor and/or sensory loss: No (12/04/21 0610), Bowel and/or bladder incontinence post epidural: No (12/04/21 0610)  [place] Surges L Nefertari Rebman

## 2021-12-04 NOTE — Lactation Note (Signed)
This note was copied from a baby's chart. Lactation Consultation Note  Patient Name: Boy Corky Tanenbaum Today's Date: 12/04/2021 Reason for consult: Initial assessment;Term Gestational HBP Age:34 hours  LC in to visit with P2 Mom of term baby delivered by C/S. Baby fed for 30 mins in PACU.  Mom reports her first baby (46 yrs old) never latched and she pumped but not enough to keep her milk supply sufficient.  Mom desires to breastfeed AND pump and bottle so FOB can help.  LC encouraged Mom to focus on baby latching to the breast to establish a full milk supply.  If Mom chooses to offer a bottle, LC recommended a DEBP to support her milk supply.   Baby placed STS on Mom's chest.  RN concerned due to circumoral cyanosis.  No increased RR or effort observed.  O2 sat 95-96%.    LC reviewed and demonstrated breast massage and hand expression.  Unable to express colostrum at present.  Baby not cueing at all.  Recommended baby remaining STS if Mom awake until he is consistently latching and feeding.   Mom encouraged to call out of assistance when baby starts cueing to eat.   Maternal Data Has patient been taught Hand Expression?: Yes Does the patient have breastfeeding experience prior to this delivery?: Yes How long did the patient breastfeed?: 2 weeks  Feeding Mother's Current Feeding Choice: Breast Milk  LATCH Score Latch: Too sleepy or reluctant, no latch achieved, no sucking elicited.  Audible Swallowing: None  Type of Nipple: Everted at rest and after stimulation  Comfort (Breast/Nipple): Soft / non-tender  Hold (Positioning): Full assist, staff holds infant at breast  LATCH Score: 4   Interventions Interventions: Breast feeding basics reviewed;Skin to skin;Breast massage;Hand express;LC Services brochure  Consult Status Consult Status: Follow-up Date: 12/05/21 Follow-up type: In-patient    Broadus John 12/04/2021, 8:07 AM

## 2021-12-04 NOTE — Transfer of Care (Signed)
Immediate Anesthesia Transfer of Care Note  Patient: Rhonda Ali  Procedure(s) Performed: CESAREAN SECTION  Patient Location: PACU  Anesthesia Type:Epidural  Level of Consciousness: awake, alert  and oriented  Airway & Oxygen Therapy: Patient Spontanous Breathing  Post-op Assessment: Report given to RN and Post -op Vital signs reviewed and stable  Post vital signs: Reviewed and stable  Last Vitals:  Vitals Value Taken Time  BP 127/69 12/04/21 0401  Temp 36.7 C 12/04/21 0356  Pulse 93 12/04/21 0402  Resp 22 12/04/21 0402  SpO2 100 % 12/04/21 0402  Vitals shown include unvalidated device data.  Last Pain:  Vitals:   12/04/21 0356  TempSrc: Oral  PainSc: 0-No pain         Complications: No notable events documented.

## 2021-12-04 NOTE — Discharge Summary (Signed)
Postpartum Discharge Summary  Date of Service updated     Patient Name: Rhonda Ali DOB: 07/25/1986 MRN: [id number]  Date of admission: 12/02/2021 Delivery date:12/04/2021  Delivering provider: Radene Gunning  Date of discharge: 12/06/2021  Admitting diagnosis: Encounter for supervision of high risk pregnancy in third trimester, antepartum [O09.93] Intrauterine pregnancy: [redacted]w[redacted]d     Secondary diagnosis:  Principal Problem:   Encounter for supervision of high risk pregnancy in third trimester, antepartum Active Problems:   Rh negative state in antepartum period   GBS bacteriuria   Anemia during pregnancy   Gestational hypertension   Delivery of pregnancy by cesarean section  Additional problems:     Discharge diagnosis: Term Pregnancy Delivered and Gestational Hypertension                                              Post partum procedures: None Augmentation: AROM, Pitocin, Cytotec, and IP Foley Complications: None  Hospital course: Induction of Labor With Cesarean Section   36 y.o. yo G3T5176 at [redacted]w[redacted]d was admitted to the hospital 12/02/2021 for induction of labor. Patient had a labor course significant for prolonged IOL with arrest of dilation at 6cm despite maximum pitocin and AROM. The patient went for cesarean section due to Arrest of Dilation. Delivery details are as follows: Membrane Rupture Time/Date: 9:04 AM ,12/03/2021   Delivery Method:C-Section, Low Transverse  Details of operation can be found in separate operative Note.  Patient had an uncomplicated postpartum course. She is ambulating, tolerating a regular diet, passing flatus, and urinating well.  Patient is discharged home in stable condition on 12/06/21.      Newborn Data: Birth date:12/04/2021  Birth time:2:59 AM  Gender:Female  Living status:Living  Apgars:8 ,9  Weight:3714 g                                Magnesium Sulfate received: No BMZ received: No Rhophylac:No (infant A negative as well)   MMR:No T-DaP:Given prenatally Flu: No Transfusion:No  Physical exam  Vitals:   12/05/21 1419 12/05/21 1510 12/05/21 2043 12/06/21 0531  BP: 140/81 (!) 133/55 128/60 135/61  Pulse: 78 75 80 74  Resp: $Remo'16  18 18  'IiBuF$ Temp: 98.1 F (36.7 C)  98.8 F (37.1 C)   TempSrc: Axillary  Oral Oral  SpO2:      Weight:      Height:       General: alert, cooperative, and no distress Lochia: appropriate Uterine Fundus: firm Incision: Dressing is clean, dry, and intact DVT Evaluation: Calf/Ankle edema is present (1+ pitting edema bilaterally to mid shin)  Labs: Lab Results  Component Value Date   WBC 16.4 (H) 12/04/2021   HGB 9.1 (L) 12/04/2021   HCT 27.9 (L) 12/04/2021   MCV 82.8 12/04/2021   PLT 179 12/04/2021   CMP Latest Ref Rng & Units 12/02/2021  Glucose 70 - 99 mg/dL 105(H)  BUN 6 - 20 mg/dL 10  Creatinine 0.44 - 1.00 mg/dL 0.64  Sodium 135 - 145 mmol/L 134(L)  Potassium 3.5 - 5.1 mmol/L 4.0  Chloride 98 - 111 mmol/L 103  CO2 22 - 32 mmol/L 21(L)  Calcium 8.9 - 10.3 mg/dL 9.5  Total Protein 6.5 - 8.1 g/dL 6.6  Total Bilirubin 0.3 - 1.2 mg/dL 0.2(L)  Alkaline Phos 38 -  126 U/L 175(H)  AST 15 - 41 U/L 17  ALT 0 - 44 U/L 11   Edinburgh Score: Edinburgh Postnatal Depression Scale Screening Tool 12/04/2021  I have been able to laugh and see the funny side of things. 0  I have looked forward with enjoyment to things. 0  I have blamed myself unnecessarily when things went wrong. 1  I have been anxious or worried for no good reason. 1  I have felt scared or panicky for no good reason. 0  Things have been getting on top of me. 0  I have been so unhappy that I have had difficulty sleeping. 0  I have felt sad or miserable. 1  I have been so unhappy that I have been crying. 0  The thought of harming myself has occurred to me. 0  Edinburgh Postnatal Depression Scale Total 3     After visit meds:  Allergies as of 12/06/2021   No Known Allergies      Medication List     TAKE  these medications    acetaminophen 500 MG tablet Commonly known as: TYLENOL Take 2 tablets (1,000 mg total) by mouth every 6 (six) hours.   ferrous sulfate 325 (65 FE) MG tablet Take 1 tablet (325 mg total) by mouth every other day.   furosemide 20 MG tablet Commonly known as: LASIX Take 1 tablet (20 mg total) by mouth daily for 4 days.   ibuprofen 600 MG tablet Commonly known as: ADVIL Take 1 tablet (600 mg total) by mouth every 6 (six) hours as needed.   NIFEdipine 30 MG 24 hr tablet Commonly known as: ADALAT CC Take 1 tablet (30 mg total) by mouth daily.   oxyCODONE 5 MG immediate release tablet Commonly known as: Oxy IR/ROXICODONE Take 1 tablet (5 mg total) by mouth every 6 (six) hours as needed for moderate pain.   Prenatal Vitamins 28-0.8 MG Tabs Take by mouth.         Discharge home in stable condition Infant Feeding: Breast Infant Disposition:home with mother Discharge instruction: per After Visit Summary and Postpartum booklet. Activity: Advance as tolerated. Pelvic rest for 6 weeks.  Diet: routine diet Future Appointments: Future Appointments  Date Time Provider [place]  12/11/2021 11:50 AM Janyth Pupa, DO [facility]  01/08/2022 11:10 AM Roma Schanz, CNM [facility]   Follow up Visit:  Message sent to [facility] by Dr Higinio Plan  Please schedule this patient for a In person postpartum visit in 6 weeks with the following provider: Any provider. Additional Postpartum F/U:Incision check 1 week and BP check 1 week  High risk pregnancy complicated by: HTN Delivery mode:  C-Section, Low Transverse  Anticipated Birth Control:  Nexplanon at pp visit    12/06/2021 [name], DO

## 2021-12-04 NOTE — Progress Notes (Signed)
Labor Progress Note Rhonda Ali is a 36 y.o. G3P1011 at [redacted]w[redacted]d presented for IOL due to gestational hypertension.   S: Called by RN for assistance in cervical check as patient is feeling more uncomfortable. Mainly in her back. Wanting to re-dose epidural after hitting button several times.   O:  BP 138/86    Pulse 87    Temp 99 F (37.2 C) (Oral)    Resp 18    Ht 5\' 4"  (1.626 m)    Wt 100.4 kg    LMP 03/04/2021    SpO2 100%    BMI 37.99 kg/m  EFM: 155/mod/15x15/none Ctx every 5-7 minutes    CVE: Dilation: 6 Effacement (%): 80 Cervical Position: Anterior Station: -3 Presentation: Vertex Exam by:: Dr. Higinio Plan   [name]: 36 y.o. G3P1011 [redacted]w[redacted]d  #Labor: Minimal to no progression with pit now at 20. Contractions infrequent about every 5-7 minutes despite current dose. ROM for 16 hours now and 2nd time for pit without progression. Offered to proceed with primary C/S now (for failure to progress) vs continue titrating pit to maximum dose as FHT currently reassuring. She is leaning towards C/S, however would like to talk to her spouse. Will check in the next 30 minutes or so and decide then.   The risks of cesarean section discussed with the patient included but were not limited to: bleeding which may require transfusion or reoperation; infection which may require antibiotics; injury to bowel, bladder, ureters or other surrounding organs; injury to the fetus; need for additional procedures including hysterectomy in the event of a life-threatening hemorrhage; placental abnormalities wth subsequent pregnancies, incisional problems, thromboembolic phenomenon and other postoperative/anesthesia complications.  [name], DO 12:27 AM

## 2021-12-05 MED ORDER — FERROUS SULFATE 325 (65 FE) MG PO TABS
325.0000 mg | ORAL_TABLET | ORAL | Status: DC
  Administered 2021-12-05: 325 mg via ORAL
  Filled 2021-12-05: qty 1

## 2021-12-05 MED ORDER — FUROSEMIDE 20 MG PO TABS
20.0000 mg | ORAL_TABLET | Freq: Every day | ORAL | Status: DC
  Administered 2021-12-05 – 2021-12-06 (×2): 20 mg via ORAL
  Filled 2021-12-05 (×2): qty 1

## 2021-12-05 NOTE — Lactation Note (Signed)
This note was copied from a baby's chart. Lactation Consultation Note  Patient Name: Boy Onalee Steinbach [id number] Date: 12/05/2021 Reason for consult: Follow-up assessment Age:34 hours  Lactation Tools Discussed/Used Tools: Pump;Flanges Flange Size: 24 Breast pump type: Double-Electric Breast Pump Pump Education: Setup, frequency, and cleaning Reason for Pumping: for supplementation Pumping frequency: Mom to pump when infant gets formula Pumped volume: 1 mL  Nfant Gold nipple used for subsequent feeding. SLP updated. Pump parts & Nfant nipples washed for Mom (she is by herself in room).   Lurline Hare [facility] 12/05/2021, 9:58 AM

## 2021-12-05 NOTE — Progress Notes (Addendum)
POSTPARTUM PROGRESS NOTE  POD #1  Subjective:  Rhonda Ali is a 36 y.o. [id number] s/p pLTCS at [redacted]w[redacted]d. No acute events overnight. She reports she is doing well. She denies any problems with ambulating, voiding or po intake. Denies nausea or vomiting. She has passed flatus. Pain is moderately controlled.  Lochia is mild.  Objective: Blood pressure 119/60, pulse 74, temperature 98 F (36.7 C), temperature source Oral, resp. rate 18, height 5\' 4"  (1.626 m), weight 100.4 kg, last menstrual period 03/04/2021, SpO2 96 %, unknown if currently breastfeeding.  Physical Exam:  General: alert, cooperative and no distress Chest: no respiratory distress Heart: regular rate, distal pulses intact Uterine Fundus: firm, appropriately tender DVT Evaluation: No calf swelling or tenderness Extremities: 1+ pitting edema to mid shin bilaterally  Skin: warm, dry; incision clean/dry/intact w/ honeycomb dressing in place  Recent Labs    12/02/21 2312 12/04/21 0422  HGB 10.3* 9.1*  HCT 31.1* 27.9*    Assessment/Plan: Rhonda Ali is a 36 y.o. [id number] s/p pLTCS at [redacted]w[redacted]d for AOD/persistent OP.  POD#1 - Doing welll; pain well controlled.   Routine postpartum care  OOB, ambulated  Lovenox for VTE prophylaxis Acute blood loss Anemia: asymptomatic  Start po ferrous sulfate every other day   Contraception: outpatient nexplanon  Feeding: breast  #Gestational hypertension: BP appearing WNL. However given history and pitting edema on exam, plan to start lasix x5 days.   Dispo: Plan for discharge tomorrow or following day.   LOS: 3 days   Darrelyn Hillock, DO OB Fellow  12/05/2021, 7:32 AM

## 2021-12-05 NOTE — Lactation Note (Signed)
This note was copied from a baby's chart. Lactation Consultation Note  Patient Name: Rhonda Ali [id number] Date: 12/05/2021 Reason for consult: Follow-up assessment;Term Age:34 hours   Infant could be heard crying from out in the hall. I went in to offer help. Mom reports difficulty with breast feeding (infant has been fussy, yet falls asleep at the breast). Infant appeared/sounded hungry; I offered formula & Mom consented. Three different bottle nipples were attempted to find a good flow rate for the infant. A msg was left for the SLP & I updated NP.   Infant may need Nfant gold nipple for next feeding.   Lurline Hare [facility] 12/05/2021, 8:57 AM

## 2021-12-05 NOTE — Social Work (Signed)
CSW received consult for hx of marijuana use.  Referral was screened out due to the following:  ~MOB had no documented substance use after initial prenatal visit/+UPT. ~MOB had no positive drug screens after initial prenatal visit/+UPT. ~Baby's UDS is negative.  Please consult CSW if current concerns arise or by MOB's request.  CSW will monitor CDS results and make report to Child Protective Services if warranted.   Vivi Barrack, MSW, LCSW [facility]  Clinical Social Worker  [phone] 12/05/2021  8:35 AM

## 2021-12-06 ENCOUNTER — Other Ambulatory Visit (HOSPITAL_COMMUNITY): Payer: Self-pay

## 2021-12-06 MED ORDER — FUROSEMIDE 20 MG PO TABS
20.0000 mg | ORAL_TABLET | Freq: Every day | ORAL | 0 refills | Status: DC
  Filled 2021-12-06: qty 4, 4d supply, fill #0

## 2021-12-06 MED ORDER — NIFEDIPINE ER OSMOTIC RELEASE 30 MG PO TB24
30.0000 mg | ORAL_TABLET | Freq: Every day | ORAL | Status: DC
  Administered 2021-12-06: 30 mg via ORAL
  Filled 2021-12-06: qty 1

## 2021-12-06 MED ORDER — ACETAMINOPHEN 500 MG PO TABS: 1000.0000 mg | ORAL_TABLET | Freq: Four times a day (QID) | ORAL | 0 refills | Status: DC

## 2021-12-06 MED ORDER — NIFEDIPINE ER 30 MG PO TB24
30.0000 mg | ORAL_TABLET | Freq: Every day | ORAL | 0 refills | Status: DC
  Filled 2021-12-06: qty 30, 30d supply, fill #0

## 2021-12-06 MED ORDER — OXYCODONE HCL 5 MG PO TABS
5.0000 mg | ORAL_TABLET | Freq: Four times a day (QID) | ORAL | 0 refills | Status: DC | PRN
Start: 2021-12-06 — End: 2022-01-08
  Filled 2021-12-06: qty 15, 4d supply, fill #0

## 2021-12-06 MED ORDER — IBUPROFEN 600 MG PO TABS
600.0000 mg | ORAL_TABLET | Freq: Four times a day (QID) | ORAL | 0 refills | Status: AC | PRN
  Filled 2021-12-06: qty 60, 15d supply, fill #0

## 2021-12-06 NOTE — Lactation Note (Signed)
This note was copied from a baby's chart. Lactation Consultation Note  Patient Name: Rhonda Ali [id number] Date: 12/06/2021 Reason for consult: Follow-up assessment;Term;Difficult latch Age:34 hours  LC in to visit with P2 Mom of term baby.  Mom was worried she didn't have enough milk for baby and started feeding baby formula.  Mom was set up with a DEBP and has pumped once for 1 ml.    Mom states she feels that she will pump more once she gets home.  Mom has a DEBP (lansinoh) at home.    Mom states that baby latches well, but she doesn't have enough to satisfy him.  Reassured Mom that pumping isn't an indication on how much milk baby is getting at the breast.  Encouraged Mom to continue offering her breast first and encouraged double pumping if baby is being supplemented with formula.   LC offered a latch assist/assess prior to her discharge. Mom will call if she chooses to.  Also, recommended she see our OP lactation consultant next week and offered to send message, Mom states she will see how things go and call prn from home.  Lactation Tools Discussed/Used Tools: Pump;Flanges;Bottle Breast pump type: Double-Electric Breast Pump Pumping frequency: once last night Pumped volume: 0 mL (drops)  Interventions Interventions: Breast feeding basics reviewed;Skin to skin;Breast massage;Hand express;DEBP;Hand pump  Discharge Discharge Education: Engorgement and breast care;Warning signs for feeding baby;Outpatient recommendation Pump: Personal;DEBP (Lansinoh DEBP)  Consult Status Consult Status: Complete Date: 12/06/21 Follow-up type: Call as needed    Rhonda Ali 12/06/2021, 11:42 AM

## 2021-12-07 ENCOUNTER — Encounter: Payer: 59 | Admitting: Women's Health

## 2021-12-11 ENCOUNTER — Encounter: Payer: Self-pay | Admitting: Obstetrics & Gynecology

## 2021-12-11 ENCOUNTER — Other Ambulatory Visit: Payer: Self-pay

## 2021-12-11 ENCOUNTER — Ambulatory Visit (INDEPENDENT_AMBULATORY_CARE_PROVIDER_SITE_OTHER): Payer: 59 | Admitting: Obstetrics & Gynecology

## 2021-12-11 VITALS — BP 131/87 | HR 78 | Ht 64.0 in | Wt 210.4 lb

## 2021-12-11 DIAGNOSIS — Z4889 Encounter for other specified surgical aftercare: Secondary | ICD-10-CM

## 2021-12-11 DIAGNOSIS — O133 Gestational [pregnancy-induced] hypertension without significant proteinuria, third trimester: Secondary | ICD-10-CM

## 2021-12-11 NOTE — Progress Notes (Signed)
° ° °  PostOp Visit Note  Rhonda Ali is a 36 y.o. E0P2330 female who presents for a postoperative visit. She is 1 week postop following a primary LTCS completed on 12/04/21   Denies fever or chills.  Tolerating gen diet.  +Flatus, Regular BMs.  Pain is well controlled, without oxycodone. Only concern is swelling in her legs- s/p Lasix with minimal to no improvement. BP well controlled with current medication.  Denies headache or blurry vision.  Overall doing well.   Review of Systems Pertinent items noted in HPI and remainder of comprehensive ROS otherwise negative.    Objective:  BP 131/87 (BP Location: Right Arm, Patient Position: Sitting, Cuff Size: Large)    Pulse 78    Ht 5\' 4"  (1.626 m)    Wt 210 lb 6.4 oz (95.4 kg)    LMP 03/04/2021    Breastfeeding No    BMI 36.12 kg/m    Physical Examination:  GENERAL ASSESSMENT: well developed and well nourished SKIN: warm and dery CHEST: normal air exchange, respiratory effort normal with no retractions HEART: regular rate and rhythm ABDOMEN: soft, non-distended, +BS INCISION: honeycomb and steris removed- incision well healed- C/D/I EXTREMITY: 2+ bilateral edema PSYCH: mood appropriate, normal affect       Assessment:    Postop check Gestational HTN   Plan:   -continue with Procardia daily, reviewed preeclampsia precautions -incision well healed- reviewed care -meeting postop milestones appropriately -plan for Nexplanon at her postpartum visit  05/04/2021, DO Attending Obstetrician & Gynecologist, [facility]onda [facility], [facility]

## 2021-12-17 ENCOUNTER — Encounter: Payer: Self-pay | Admitting: Family Medicine

## 2021-12-24 ENCOUNTER — Encounter: Payer: Self-pay | Admitting: *Deleted

## 2021-12-24 ENCOUNTER — Encounter: Payer: Self-pay | Admitting: Obstetrics & Gynecology

## 2022-01-08 ENCOUNTER — Encounter: Payer: Self-pay | Admitting: Women's Health

## 2022-01-08 ENCOUNTER — Ambulatory Visit (INDEPENDENT_AMBULATORY_CARE_PROVIDER_SITE_OTHER): Payer: 59 | Admitting: Women's Health

## 2022-01-08 ENCOUNTER — Other Ambulatory Visit: Payer: Self-pay

## 2022-01-08 DIAGNOSIS — Z3202 Encounter for pregnancy test, result negative: Secondary | ICD-10-CM

## 2022-01-08 DIAGNOSIS — Z30017 Encounter for initial prescription of implantable subdermal contraceptive: Secondary | ICD-10-CM

## 2022-01-08 LAB — POCT URINE PREGNANCY: Preg Test, Ur: NEGATIVE

## 2022-01-08 MED ORDER — ETONOGESTREL 68 MG ~~LOC~~ IMPL
68.0000 mg | DRUG_IMPLANT | Freq: Once | SUBCUTANEOUS | Status: AC
Start: 2022-01-08 — End: 2022-01-08
  Administered 2022-01-08: 68 mg via SUBCUTANEOUS

## 2022-01-08 NOTE — Patient Instructions (Signed)
Keep the area clean and dry.  You can remove the big bandage in 24 hours, and the small steri-strip bandage in 3-5 days.  A back up method, such as condoms, should be used for two weeks. You may have irregular vaginal bleeding for the first 6 months after the Nexplanon is placed, then the bleeding usually lightens and it is possible that you may not have any periods.  If you have any concerns, please give us a call.   ? ?Etonogestrel Implant ?What is this medication? ?ETONOGESTREL (et oh noe JES trel) prevents ovulation and pregnancy. It belongs to a group of medications called contraceptives. This medication is a progestin hormone. ?This medicine may be used for other purposes; ask your health care provider or pharmacist if you have questions. ?COMMON BRAND NAME(S): Implanon, Nexplanon ?What should I tell my care team before I take this medication? ?They need to know if you have any of these conditions: ?Abnormal vaginal bleeding ?Blood vessel disease or blood clots ?Breast, cervical, endometrial, ovarian, liver, or uterine cancer ?Diabetes ?Gallbladder disease ?Heart disease or recent heart attack ?High blood pressure ?High cholesterol or triglycerides ?Kidney disease ?Liver disease ?Migraine headaches ?Seizures ?Stroke ?Tobacco smoker ?An unusual or allergic reaction to etonogestrel, anesthetics or antiseptics, other medications, foods, dyes, or preservatives ?Pregnant or trying to get pregnant ?Breast-feeding ?How should I use this medication? ?This device is inserted just under the skin on the inner side of your upper arm by your care team. ?Talk to your care team about the use of this medication in children. Special care may be needed. ?Overdosage: If you think you have taken too much of this medicine contact a poison control center or emergency room at once. ?NOTE: This medicine is only for you. Do not share this medicine with others. ?What if I miss a dose? ?This does not apply. ?What may interact with this  medication? ?Do not take this medication with any of the following: ?Amprenavir ?Fosamprenavir ?This medication may also interact with the following: ?Acitretin ?Aprepitant ?Armodafinil ?Bexarotene ?Bosentan ?Carbamazepine ?Certain medications for fungal infections like fluconazole, ketoconazole, itraconazole and voriconazole ?Certain medications to treat hepatitis, HIV or AIDS ?Cyclosporine ?Felbamate ?Griseofulvin ?Lamotrigine ?Modafinil ?Oxcarbazepine ?Phenobarbital ?Phenytoin ?Primidone ?Rifabutin ?Rifampin ?Rifapentine ?St. John's wort ?Topiramate ?This list may not describe all possible interactions. Give your health care provider a list of all the medicines, herbs, non-prescription drugs, or dietary supplements you use. Also tell them if you smoke, drink alcohol, or use illegal drugs. Some items may interact with your medicine. ?What should I watch for while using this medication? ?This product does not protect you against HIV infection (AIDS) or other sexually transmitted diseases. ?You should be able to feel the implant by pressing your fingertips over the skin where it was inserted. Contact your care team if you cannot feel the implant, and use a non-hormonal birth control method (such as condoms) until your care team confirms that the implant is in place. Contact your care team if you think that the implant may have broken or become bent while in your arm. ?You will receive a user card from your care team after the implant is inserted. The card is a record of the location of the implant in your upper arm and when it should be removed. Keep this card with your health records. ?What side effects may I notice from receiving this medication? ?Side effects that you should report to your care team as soon as possible: ?Allergic reactions--skin rash, itching, hives, swelling of   the face, lips, tongue, or throat ?Blood clot--pain, swelling, or warmth in the leg, shortness of breath, chest pain ?Gallbladder  problems--severe stomach pain, nausea, vomiting, fever ?Increase in blood pressure ?Liver injury--right upper belly pain, loss of appetite, nausea, light-colored stool, dark yellow or brown urine, yellowing skin or eyes, unusual weakness or fatigue ?New or worsening migraines or headaches ?Pain, redness, or irritation at injection site ?Stroke--sudden numbness or weakness of the face, arm, or leg, trouble speaking, confusion, trouble walking, loss of balance or coordination, dizziness, severe headache, change in vision ?Unusual vaginal discharge, itching, or odor ?Worsening mood, feelings of depression ?Side effects that usually do not require medical attention (report to your care team if they continue or are bothersome): ?Breast pain or tenderness ?Dark patches of skin on the face or other sun-exposed areas ?Irregular menstrual cycles or spotting ?Nausea ?Weight gain ?This list may not describe all possible side effects. Call your doctor for medical advice about side effects. You may report side effects to FDA at 1-800-FDA-1088. ?Where should I keep my medication? ?This medication is given in a hospital or clinic and will not be stored at home. ?NOTE: This sheet is a summary. It may not cover all possible information. If you have questions about this medicine, talk to your doctor, pharmacist, or health care provider. ?? 2022 Elsevier/Gold Standard (2021-07-10 00:00:00) ? ?

## 2022-01-08 NOTE — Progress Notes (Addendum)
° °POSTPARTUM VISIT °Patient name: Rhonda Ali MRN [id number]  Date of birth: 05/19/1986 °Chief Complaint:   °Postpartum Care ° °History of Present Illness:   °Rhonda Ali is a 35 y.o. G3P2012 Caucasian female being seen today for a postpartum visit. She is 5 weeks postpartum following a primary cesarean section, low transverse incision at 39.2 gestational weeks for FTP @ 6cm after 2d induction, baby was OP. IOL: yes, for gestational hypertension . Anesthesia: epidural.  Laceration: n/a.  Complications: none. Inpatient contraception: no.   °Pregnancy complicated by GHTN . °Tobacco use: former . Substance use disorder: no. °Last pap smear: 09/11/20 and results were NILM w/ HRHPV negative. Next pap smear due: 2024 °No LMP recorded. ° °Postpartum course has been complicated by PPHTN, stopped nifedipine this past Friday. Bleeding  intermittent scant . Bowel function is normal. Bladder function is normal. Urinary incontinence? no, fecal incontinence? no °Patient is not sexually active. Last sexual activity: prior to birth of baby. Desired contraception: Nexplanon. Patient does not know want a pregnancy in the future.  Desired family size is uncertain #of children.  ° Upstream - 01/08/22 1112   ° °  ° Pregnancy Intention Screening  ° Does the patient want to become pregnant in the next year? No   ° Does the patient's partner want to become pregnant in the next year? No   ° Would the patient like to discuss contraceptive options today? Yes   °  ° Contraception Wrap Up  ° Current Method Abstinence   ° End Method Hormonal Implant   ° Contraception Counseling Provided No   ° °  °  ° °  ° °The pregnancy intention screening data noted above was reviewed. Potential methods of contraception were discussed. The patient elected to proceed with Hormonal Implant. ° °Edinburgh Postpartum Depression Screening: negative ° Edinburgh Postnatal Depression Scale - 01/08/22 1109   ° °  ° Edinburgh Postnatal Depression Scale:  In the  Past 7 Days  ° I have been able to laugh and see the funny side of things. 0   ° I have looked forward with enjoyment to things. 0   ° I have blamed myself unnecessarily when things went wrong. 2   ° I have been anxious or worried for no good reason. 1   ° I have felt scared or panicky for no good reason. 0   ° Things have been getting on top of me. 0   ° I have been so unhappy that I have had difficulty sleeping. 0   ° I have felt sad or miserable. 0   ° I have been so unhappy that I have been crying. 0   ° The thought of harming myself has occurred to me. 0   ° Edinburgh Postnatal Depression Scale Total 3   ° °  °  ° °  °  °GAD 7 : Generalized Anxiety Score 10/03/2021 05/28/2021 09/11/2020  °Nervous, Anxious, on Edge 0 1 1  °Control/stop worrying 0 0 0  °Worry too much - different things 0 1 0  °Trouble relaxing 0 0 0  °Restless 0 0 0  °Easily annoyed or irritable 0 0 0  °Afraid - awful might happen 0 0 0  °Total GAD 7 Score 0 2 1  ° ° ° °Baby's course has been uncomplicated. Baby is feeding by bottle. Infant has a pediatrician/family doctor? Yes.  Childcare strategy if returning to work/school: family.  Pt has material needs met for her and baby:   Yes.   °Review of Systems:   °Pertinent items are noted in HPI °Denies Abnormal vaginal discharge w/ itching/odor/irritation, headaches, visual changes, shortness of breath, chest pain, abdominal pain, severe nausea/vomiting, or problems with urination or bowel movements. °Pertinent History Reviewed:  °Reviewed past medical,surgical, obstetrical and family history.  °Reviewed problem list, medications and allergies. °OB History  °Gravida Para Term Preterm AB Living  °3 2 2   1 2  °SAB IAB Ectopic Multiple Live Births  °1     0 2  °  °# Outcome Date GA Lbr Len/2nd Weight Sex Delivery Anes PTL Lv  °3 Term 12/04/21 [redacted]w[redacted]d  8 lb 3 oz (3.714 kg) M CS-LTranv EPI  LIV  °2 SAB 09/26/20          °   Birth Comments: blighted ovum  °1 Term 06/01/09 [redacted]w[redacted]d  7 lb 13 oz (3.544 kg) M  Vag-Spont EPI N LIV  ° °Physical Assessment:  ° °Vitals:  ° 01/08/22 1103  °BP: 106/68  °Pulse: 78  °Weight: 203 lb (92.1 kg)  °Height: 5' 4" (1.626 m)  °Body mass index is 34.84 kg/m². ° °     Physical Examination:  ° General appearance: alert, well appearing, and in no distress ° Mental status: alert, oriented to person, place, and time ° Skin: warm & dry  ° Cardiovascular: normal heart rate noted  ° Respiratory: normal respiratory effort, no distress  ° Breasts: deferred, no complaints  ° Abdomen: soft, non-tender, c/s incision healing well, small open spot Lt side w/ visible suture- suture clipped ° Pelvic: examination not indicated. Thin prep pap obtained: No ° Rectal: not examined ° Extremities: Edema: none  ° °Chaperone: N/A   °      °Results for orders placed or performed in visit on 01/08/22 (from the past 24 hour(s))  °POCT urine pregnancy  ° Collection Time: 01/08/22 11:08 AM  °Result Value Ref Range  ° Preg Test, Ur Negative Negative  °  ° °NEXPLANON INSERTION ° °Risks/benefits/side effects of Nexplanon have been discussed and her questions have been answered.  Specifically, a failure rate of 11/998 has been reported, with an increased failure rate if pt takes St. John's Wort and/or antiseizure medicaitons.  She is aware of the common side effect of irregular bleeding, which the incidence of decreases over time. Signed copy of informed consent in chart.  ° °Time out was performed. ° °She is right-handed, so her left arm, approximately 10cm from the medial epicondyle and 3-5cm posterior to the sulcus, was cleansed with alcohol and anesthetized with 2cc of 2% Lidocaine.  The area was cleansed again with betadine and the Nexplanon was inserted per manufacturer's recommendations without difficulty.  3 steri-strips and pressure bandage were applied. The patient tolerated the procedure well.  ° °Assessment & Plan:  °1) Postpartum exam °2) 5 wks s/p primary cesarean section, low transverse incision °3) bottle  feeding °4) Depression screening °5) Nexplanon insertion °Pt was instructed to keep the area clean and dry, remove pressure bandage in 24 hours, and keep insertion site covered with the steri-strip for 3-5 days.  Condoms for 2 weeks.  She was given a card indicating date Nexplanon was inserted and date it needs to be removed. Follow-up PRN problems. ° °Essential components of care per ACOG recommendations: ° °1.  Mood and well being:  °If positive depression screen, discussed and plan developed.  °If using tobacco we discussed reduction/cessation and risk of relapse °If current substance abuse, we discussed and   and referral to local resources was offered.   2. Infant care and feeding:  If breastfeeding, discussed returning to work, pumping, breastfeeding-associated pain, guidance regarding return to fertility while lactating if not using another method. If needed, patient was provided with a letter to be allowed to pump q 2-3hrs to support lactation in a private location with access to a refrigerator to store breastmilk.   Recommended that all caregivers be immunized for flu, pertussis and other preventable communicable diseases If pt does not have material needs met for her/baby, referred to local resources for help obtaining these.  3. Sexuality, contraception and birth spacing Provided guidance regarding sexuality, management of dyspareunia, and resumption of intercourse Discussed avoiding interpregnancy interval <26mhs and recommended birth spacing of 18 months  4. Sleep and fatigue Discussed coping options for fatigue and sleep disruption Encouraged family/partner/community support of 4 hrs of uninterrupted sleep to help with mood and fatigue  5. Physical recovery  If pt had a C/S, assessed incisional pain and providing guidance on normal vs prolonged recovery If pt had a laceration, perineal healing and pain reviewed.  If urinary or fecal incontinence, discussed management and referred to PT or  uro/gyn if indicated  Patient is safe to resume physical activity. Discussed attainment of healthy weight.  6.  Chronic disease management Discussed pregnancy complications if any, and their implications for future childbearing and long-term maternal health. Review recommendations for prevention of recurrent pregnancy complications, such as 17 hydroxyprogesterone caproate to reduce risk for recurrent PTB not applicable, or aspirin to reduce risk of preeclampsia yes. Pt had GDM: No. If yes, 2hr GTT scheduled: not applicable. Reviewed medications and non-pregnant dosing including consideration of whether pt is breastfeeding using a reliable resource such as LactMed: not applicable Referred for f/u w/ PCP or subspecialist providers as indicated: not applicable  7. Health maintenance Mammogram at [phone] earlier if indicated Pap smears as indicated  Meds:  Meds ordered this encounter  Medications   etonogestrel (NEXPLANON) implant 68 mg    Follow-up: Return in about 1 year (around 01/09/2023) for Pap & physical.   Orders Placed This Encounter  Procedures   POCT urine pregnancy    [facility]3/05/2022 11:32 AM

## 2022-02-15 ENCOUNTER — Encounter: Payer: Self-pay | Admitting: Obstetrics & Gynecology

## 2023-01-02 ENCOUNTER — Encounter: Payer: Self-pay | Admitting: Radiology

## 2023-07-30 ENCOUNTER — Ambulatory Visit (INDEPENDENT_AMBULATORY_CARE_PROVIDER_SITE_OTHER): Payer: BC Managed Care – PPO | Admitting: Adult Health

## 2023-07-30 ENCOUNTER — Encounter: Payer: Self-pay | Admitting: Adult Health

## 2023-07-30 VITALS — BP 132/73 | HR 69 | Ht 64.0 in | Wt 177.0 lb

## 2023-07-30 DIAGNOSIS — N611 Abscess of the breast and nipple: Secondary | ICD-10-CM | POA: Diagnosis not present

## 2023-07-30 MED ORDER — AMOXICILLIN-POT CLAVULANATE 875-125 MG PO TABS: 1.0000 | ORAL_TABLET | Freq: Two times a day (BID) | ORAL | 0 refills | Status: DC

## 2023-07-30 NOTE — Progress Notes (Addendum)
Subjective:     Patient ID: Rhonda Ali, female   DOB: 22-May-1986, 37 y.o.   MRN: [id number]  HPI Rhonda Ali is a 37 year old white female, married, G4W1027, in complaining of spot on left breast that started about a month ago and it popped like a zit and then cam back 9/21 and popped and drained Monday 9/23 and it is red. She thought may have had dog hair in it.     Component Value Date/Time   DIAGPAP  09/11/2020 0846    - Negative for intraepithelial lesion or malignancy (NILM)   DIAGPAP  06/27/2017 0000    NEGATIVE FOR INTRAEPITHELIAL LESIONS OR MALIGNANCY. BENIGN REACTIVE/REPARATIVE CHANGES.   DIAGPAP SHIFT IN [place]ORA SUGGESTIVE OF BACTERIAL VAGINOSIS. 06/27/2017 0000   HPVHIGH Negative 09/11/2020 0846   ADEQPAP  09/11/2020 0846    Satisfactory for evaluation; transformation zone component PRESENT.   ADEQPAP  06/27/2017 0000    Satisfactory for evaluation  endocervical/transformation zone component PRESENT.    Review of Systems +spot on left breast that started about a month ago and it popped like a zit and then cam back 9/21 and popped and drained Monday 9/23 and it is red.   Reviewed past medical,surgical, social and family history. Reviewed medications and allergies.  Objective:   Physical Exam BP 132/73 (BP Location: Left Arm, Patient Position: Sitting, Cuff Size: Normal)   Pulse 69   Ht 5\' 4"  (1.626 m)   Wt 177 lb (80.3 kg)   LMP 07/16/2023 (Approximate)   Breastfeeding No   BMI 30.38 kg/m      Skin warm and dry,  Breasts:no dominate palpable mass, retraction or nipple discharge on the right, on the left, no retraction or nipple discharge, has firmness at about 3 0'clock at edge of areola, and has 3 x 4 cm area of redness from 3-5 o'clock, and has opening where drained, no drainage now.   Fall risk is low  Upstream - 07/30/23 0935       Pregnancy Intention Screening   Does the patient want to become pregnant in the next year? No    Does the patient's partner want to  become pregnant in the next year? No    Would the patient like to discuss contraceptive options today? No      Contraception Wrap Up   Current Method Hormonal Implant    End Method Hormonal Implant    Contraception Counseling Provided Yes             Assessment:     1. Left breast abscess +spot on left breast that started about a month ago and it popped like a zit and then cam back 9/21 and popped and drained Monday 9/23 and it is red.   +has firmness at about 3 0'clock at edge of areola, and has 3 x 4 cm area of redness from 3-5 o'clock, and has opening where drained, no drainage now. Will rx Augmentin 875-125 mg 1 bid x 14 days Can use warm compress, do not squeeze and keep covered  Meds ordered this encounter  Medications   amoxicillin-clavulanate (AUGMENTIN) 875-125 MG tablet    Sig: Take 1 tablet by mouth 2 (two) times daily.    Dispense:  28 tablet    Refill:  0    Order Specific Question:   Supervising Provider    Answer:   Lazaro Arms [[id number]]   Will recheck in 2 weeks  Plan:     Return 2 weeks  for recheck, if not totally resolved will get [place]

## 2023-08-04 ENCOUNTER — Other Ambulatory Visit: Payer: Self-pay | Admitting: Adult Health

## 2023-08-04 MED ORDER — FLUCONAZOLE 150 MG PO TABS: ORAL_TABLET | ORAL | 1 refills | Status: DC

## 2023-08-04 NOTE — Progress Notes (Signed)
Rx sent in for diflucan.   

## 2023-08-13 ENCOUNTER — Ambulatory Visit: Payer: BC Managed Care – PPO | Admitting: Adult Health

## 2023-09-23 ENCOUNTER — Other Ambulatory Visit: Payer: Self-pay | Admitting: Adult Health

## 2023-09-23 MED ORDER — FLUCONAZOLE 150 MG PO TABS: ORAL_TABLET | ORAL | 1 refills | Status: AC

## 2023-09-23 MED ORDER — AMOXICILLIN-POT CLAVULANATE 875-125 MG PO TABS: 1.0000 | ORAL_TABLET | Freq: Two times a day (BID) | ORAL | 0 refills | Status: AC

## 2023-09-23 NOTE — Progress Notes (Signed)
Refilled augmentin

## 2023-09-23 NOTE — Progress Notes (Signed)
Refilled diflucan
